# Patient Record
Sex: Female | Born: 1950 | Race: White | Hispanic: No | Marital: Married | Smoking: Never smoker
Health system: Southern US, Community
[De-identification: ages and names within clinical notes are randomized; demographics above are authoritative.]

## PROBLEM LIST (undated history)

## (undated) DIAGNOSIS — R42 Dizziness and giddiness: Secondary | ICD-10-CM

## (undated) DIAGNOSIS — G8929 Other chronic pain: Secondary | ICD-10-CM

## (undated) DIAGNOSIS — K219 Gastro-esophageal reflux disease without esophagitis: Secondary | ICD-10-CM

## (undated) DIAGNOSIS — R51 Headache: Secondary | ICD-10-CM

## (undated) DIAGNOSIS — R1310 Dysphagia, unspecified: Secondary | ICD-10-CM

## (undated) DIAGNOSIS — M25512 Pain in left shoulder: Secondary | ICD-10-CM

## (undated) DIAGNOSIS — I10 Essential (primary) hypertension: Secondary | ICD-10-CM

## (undated) DIAGNOSIS — F32A Depression, unspecified: Secondary | ICD-10-CM

## (undated) DIAGNOSIS — F329 Major depressive disorder, single episode, unspecified: Secondary | ICD-10-CM

## (undated) DIAGNOSIS — M549 Dorsalgia, unspecified: Secondary | ICD-10-CM

## (undated) HISTORY — PX: ESOPHAGOGASTRODUODENOSCOPY: SHX1529

## (undated) HISTORY — PX: TRACHEOSTOMY: SUR1362

## (undated) HISTORY — DX: Depression, unspecified: F32.A

## (undated) HISTORY — PX: COLONOSCOPY: SHX174

## (undated) HISTORY — PX: TUBAL LIGATION: SHX77

## (undated) HISTORY — DX: Major depressive disorder, single episode, unspecified: F32.9

---

## 2000-06-06 ENCOUNTER — Emergency Department (HOSPITAL_COMMUNITY): Admission: EM | Admit: 2000-06-06 | Discharge: 2000-06-06 | Payer: Self-pay | Admitting: *Deleted

## 2000-06-06 ENCOUNTER — Encounter: Payer: Self-pay | Admitting: *Deleted

## 2001-11-27 ENCOUNTER — Emergency Department (HOSPITAL_COMMUNITY): Admission: EM | Admit: 2001-11-27 | Discharge: 2001-11-27 | Payer: Self-pay | Admitting: Emergency Medicine

## 2004-10-08 ENCOUNTER — Emergency Department (HOSPITAL_COMMUNITY): Admission: EM | Admit: 2004-10-08 | Discharge: 2004-10-08 | Payer: Self-pay | Admitting: Emergency Medicine

## 2006-01-27 ENCOUNTER — Emergency Department (HOSPITAL_COMMUNITY): Admission: EM | Admit: 2006-01-27 | Discharge: 2006-01-27 | Payer: Self-pay | Admitting: Emergency Medicine

## 2006-12-12 ENCOUNTER — Emergency Department (HOSPITAL_COMMUNITY): Admission: EM | Admit: 2006-12-12 | Discharge: 2006-12-12 | Payer: Self-pay | Admitting: *Deleted

## 2007-10-24 ENCOUNTER — Encounter: Admission: RE | Admit: 2007-10-24 | Discharge: 2007-10-24 | Payer: Self-pay | Admitting: Family Medicine

## 2007-11-08 ENCOUNTER — Ambulatory Visit: Payer: Self-pay | Admitting: Gastroenterology

## 2007-11-14 ENCOUNTER — Ambulatory Visit (HOSPITAL_COMMUNITY): Admission: RE | Admit: 2007-11-14 | Discharge: 2007-11-14 | Payer: Self-pay | Admitting: Gastroenterology

## 2007-11-14 ENCOUNTER — Ambulatory Visit: Payer: Self-pay | Admitting: Gastroenterology

## 2008-04-15 ENCOUNTER — Observation Stay (HOSPITAL_COMMUNITY): Admission: EM | Admit: 2008-04-15 | Discharge: 2008-04-18 | Payer: Self-pay | Admitting: Emergency Medicine

## 2010-04-22 LAB — COMPREHENSIVE METABOLIC PANEL
Albumin: 3.9 g/dL (ref 3.5–5.2)
BUN: 6 mg/dL (ref 6–23)
Calcium: 9.1 mg/dL (ref 8.4–10.5)
Chloride: 103 mEq/L (ref 96–112)
Creatinine, Ser: 0.81 mg/dL (ref 0.4–1.2)
GFR calc Af Amer: >60 mL/min (ref >60)
Total Bilirubin: 0.4 mg/dL (ref 0.3–1.2)
Total Protein: 7.6 g/dL (ref 6.0–8.3)

## 2010-04-22 LAB — URINALYSIS, ROUTINE W REFLEX MICROSCOPIC
Hgb urine dipstick: NEGATIVE
Nitrite: NEGATIVE
Specific Gravity, Urine: 1.025 (ref 1.005–1.030)
Urobilinogen, UA: 0.2 mg/dL (ref 0.0–1.0)
pH: 6.5 (ref 5.0–8.0)

## 2010-04-22 LAB — CBC
HCT: 38.8 % (ref 36.0–46.0)
HCT: 41.5 % (ref 36.0–46.0)
Hemoglobin: 13.1 g/dL (ref 12.0–15.0)
MCHC: 33.5 g/dL (ref 30.0–36.0)
MCHC: 34 g/dL (ref 30.0–36.0)
MCHC: 34.3 g/dL (ref 30.0–36.0)
MCV: 90.1 fL (ref 78.0–100.0)
MCV: 90.4 fL (ref 78.0–100.0)
MCV: 91.1 fL (ref 78.0–100.0)
Platelets: 313 10*3/uL (ref 150–400)
RBC: 4.24 MIL/uL (ref 3.87–5.11)
RBC: 4.3 MIL/uL (ref 3.87–5.11)
RDW: 14.4 % (ref 11.5–15.5)
WBC: 11.4 10*3/uL — ABNORMAL HIGH (ref 4.0–10.5)
WBC: 8.2 10*3/uL (ref 4.0–10.5)

## 2010-04-22 LAB — LIPID PANEL
Cholesterol: 245 mg/dL — ABNORMAL HIGH (ref 0–200)
LDL Cholesterol: 189 mg/dL — ABNORMAL HIGH (ref 0–99)
VLDL: 11 mg/dL (ref 0–40)

## 2010-04-22 LAB — DIFFERENTIAL
Basophils Absolute: 0 10*3/uL (ref 0.0–0.1)
Basophils Relative: 1 % (ref 0–1)
Basophils Relative: 1 % (ref 0–1)
Eosinophils Absolute: 0 10*3/uL (ref 0.0–0.7)
Eosinophils Absolute: 0 10*3/uL (ref 0.0–0.7)
Eosinophils Relative: 0 % (ref 0–5)
Lymphocytes Relative: 9 % — ABNORMAL LOW (ref 12–46)
Lymphs Abs: 0.7 10*3/uL (ref 0.7–4.0)
Lymphs Abs: 1 10*3/uL (ref 0.7–4.0)
Lymphs Abs: 2.1 10*3/uL (ref 0.7–4.0)
Monocytes Absolute: 0.1 10*3/uL (ref 0.1–1.0)
Monocytes Absolute: 0.4 10*3/uL (ref 0.1–1.0)
Monocytes Absolute: 0.8 10*3/uL (ref 0.1–1.0)
Monocytes Relative: 10 % (ref 3–12)
Monocytes Relative: 3 % (ref 3–12)
Neutro Abs: 6.9 10*3/uL (ref 1.7–7.7)
Neutro Abs: 9.9 10*3/uL — ABNORMAL HIGH (ref 1.7–7.7)
Neutrophils Relative %: 87 % — ABNORMAL HIGH (ref 43–77)

## 2010-04-22 LAB — URINE CULTURE: Colony Count: NO GROWTH

## 2010-04-22 LAB — BASIC METABOLIC PANEL
CO2: 25 mEq/L (ref 19–32)
CO2: 26 mEq/L (ref 19–32)
Chloride: 106 mEq/L (ref 96–112)
Chloride: 107 mEq/L (ref 96–112)
Creatinine, Ser: 0.66 mg/dL (ref 0.4–1.2)
GFR calc Af Amer: >60 mL/min (ref >60)
GFR calc Af Amer: >60 mL/min (ref >60)
Potassium: 3.9 mEq/L (ref 3.5–5.1)
Potassium: 4 mEq/L (ref 3.5–5.1)
Sodium: 137 mEq/L (ref 135–145)

## 2010-04-22 LAB — PROTIME-INR
INR: 1 (ref 0.00–1.49)
Prothrombin Time: 13.1 seconds (ref 11.6–15.2)

## 2010-04-22 LAB — CULTURE, BLOOD (ROUTINE X 2)

## 2010-04-22 LAB — TRICYCLICS SCREEN, URINE: TCA Scrn: NOT DETECTED

## 2010-04-22 LAB — RAPID URINE DRUG SCREEN, HOSP PERFORMED
Amphetamines: NOT DETECTED
Benzodiazepines: NOT DETECTED

## 2010-04-22 LAB — POCT CARDIAC MARKERS

## 2010-04-22 LAB — HEMOGLOBIN A1C: Mean Plasma Glucose: 114 mg/dL
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
## 2010-05-26 NOTE — Group Therapy Note (Signed)
NAMEKATRINKA, [name] NO.:  [id number]   MEDICAL RECORD NO.:  [id number]          PATIENT TYPE:  OBV   LOCATION:  A319                          FACILITY:  [facility]   PHYSICIAN:  Osvaldo Shipper, MD     DATE OF BIRTH:  [birth year]   DATE OF PROCEDURE:  04/17/2008  DATE OF DISCHARGE:                                 PROGRESS NOTE   SUBJECTIVE:  The patient denies any nausea, vomiting or diarrhea;  however, she mentioned that her dizziness still persists.  She describes  it as a spinning of the room around her.  She mentioned an episode just  a few minutes ago, and she is very disconcerted by this symptomatology.  She admits to ringing in the ears.  She admits to having hearing loss in  the right ear many years ago but not currently.  She has not been  diagnosed with any ear problem.  She denies any focal deficits.   VITAL SIGNS:  Temperature:  Finally, she is afebrile, 98.2.  Heart rate  78, respiratory rate 20, blood pressure 158/79, saturation 98% on room  air.  GENERAL EXAM:  This is an overweight white female in no distress.  HEENT:  No pallor.  No icterus.  No nystagmus was noted.  LUNGS:  Clear to auscultation bilaterally.  No wheezing, rales or  rhonchi.  CARDIOVASCULAR:  S1, S2 is normal, regular.  No murmurs appreciated.  No  S3, S4.  No rubs.  No bruits.  ABDOMEN:  Soft, nontender, nondistended.  Bowel sounds are present.  No  masses or organomegaly are appreciated.  EXTREMITIES:  Show no peripheral edema.  MUSCULOSKELETAL:  Unremarkable.  NEUROLOGIC:  She is alert and oriented x3.  No focal neurologic deficits  are present.  Cerebellar signs were negative.   Labs:  CBC unremarkable.  BMET unremarkable.  Glucose actually was 105.  HgbA1c was 5.6.  Total cholesterol 245, LDL is 189.   ASSESSMENT AND PLAN:  1. Dizziness, most likely vertigo; however, considering her high blood      pressures we need to rule out intracranial process, so we will get      an  MRI of the brain.  We will in the meantime start her on      meclizine and Valium.  Antiemetics are being provided on an as-      needed basis.  PT evaluation will be done.  2. Hypertension.  Blood pressures are better controlled after she was      started on Lopressor, which will be continued.  3. Nausea, vomiting and diarrhea, likely secondary to gastroenteritis.      These have also improved.  IV will be saline-locked.  4. Dyslipidemia.  I will defer treatment for this to a primary medical      doctor.  The patient does not have one.  She said she has a lot of      financial difficulties with affording medications.  Without any      kind of followup, I will be reluctant to start the patient on a  statin agent.  These cholesterol levels will need to be discussed      with the patient at the time of discharge.  5. She is on DVT prophylaxis with sequential compressive devices.  6. Protonix will be changed over to p.o.   So we await MRI.  We await improvement in her symptomatology.  I am  hoping if everything comes back negative and if she is better, she  should be able to go home in the next 24-36 hours.      Osvaldo Shipper, MD  Electronically Signed     GK/MEDQ  D:  04/17/2008  T:  04/17/2008  Job:  [id number]

## 2010-05-26 NOTE — Discharge Summary (Signed)
[name], CARBARY NO.:  [id number]   MEDICAL RECORD NO.:  [id number]          PATIENT TYPE:  OBV   LOCATION:  A319                          FACILITY:  [facility]   PHYSICIAN:  Dorris Singh, DO    DATE OF BIRTH:  [birth year]   DATE OF ADMISSION:  04/15/2008  DATE OF DISCHARGE:  04/08/2010LH                               DISCHARGE SUMMARY   PRIMARY CARE PHYSICIAN:  A doctor in [place], his name is Dr.  Tanya Nones.   There were no consults made.   ADMISSION DIAGNOSES:  1. Acute viral illness.  2. Possible acute gastroenteritis.  3. Transient rectal bleeding.  4. High blood pressure.  5. History of high cholesterol.   DISCHARGE DIAGNOSES:  1. Vertigo.  2. Hypertension.  3. Nausea and vomiting, which has resolved.  4. High cholesterol.   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  A carotid Doppler as well on the 7th which  demonstrated no evidence of significant plaque formation or stenosis.   To summarize her hospital stay, the patient is a 57-year Caucasian  female with the medical history noted in her H and P, who had a 12-hour  duration of nausea and vomiting with a couple episodes of diarrhea and  dizziness.  She was admitted to the hospital with the above diagnoses.  There were concerns as to whether that could be food poisoning or  influenza.  The patient continued to progress and started to complain  more of dizziness spells, more along the lines of vertigo.  She was  started on meclizine and this seemed to improve.  Also, there were some  issues with some transient rectal bleeding, but this was secondary to  the gastroenteritis.  On the 7th the patient stated that she felt much  better.  However, she still was concerned about the dizziness, so  testing was done to ensure that this was not an intracranial process and  her tests were negative.  On the 8th the patient stated that she wanted  to go home.  She felt 100% better, was asked if she wanted home health  care.  She stated no, that her family and her husband can take care of  her.   DISCHARGE MEDICATIONS INCLUDE:  Tylenol as needed.  The patient did not  have her blood pressure or cholesterol pills because she cannot afford  to take them, this was in her H and P.  She will be sent home on  meclizine 12.5 mg 1 p.o. t.i.d.   It is recommended also for her to follow up with Dr. Tanya Nones, we will  set up an appointment for her prior to discharge.  Also, if symptoms  worsen, she is to return.   CONDITION ON DISCHARGE:  Stable.   Her vitals were reviewed as well as her lab work.  Dorris Singh, DO  Electronically Signed     CB/MEDQ  D:  04/18/2008  T:  04/18/2008  Job:  [id number]

## 2010-05-26 NOTE — Op Note (Signed)
[name], [name]              ACCOUNT NO.:  [id number]   MEDICAL RECORD NO.:  [id number]          PATIENT TYPE:  AMB   LOCATION:  DAY                           FACILITY:  [facility]   PHYSICIAN:  Kassie Mends, M.D.      DATE OF BIRTH:  [birth year]   DATE OF PROCEDURE:  11/14/2007  DATE OF DISCHARGE:                               OPERATIVE REPORT   REFERRING PHYSICIAN:  Broadus John T. Pamalee Leyden, MD, of [facility].   PROCEDURE:  Colonoscopy.   INDICATIONS FOR EXAM:  [name] is a 60 year old female who presents  with rectal bleeding.   FINDINGS:  1. Pancolonic diverticulosis most pronounced in the sigmoid colon.  2. Slightly tortuous colon.  Otherwise, no polyps, masses,      inflammatory changes, or AVMs seen.  3. Small internal hemorrhoids.  Otherwise, normal retroflexed view of      the rectum.   RECOMMENDATIONS:  1. Anusol-HC per rectum every 12 hours for 10 days.  2. She should follow a high-fiber diet.  She is given a handout on      high-fiber diet, constipation, and hemorrhoids.  3. No aspirin, NSAIDs, or anticoagulation for 7 days.  4. She should drink 6-8 cups of water daily and add Benefiber twice a      day.  5. Screening colonoscopy in 10 years.  6. Follow up with Lorenza Burton in 2 months regarding her      constipation.   MEDICATIONS:  1. Demerol 100 mg IV.  2. Versed 5 mg IV.   PROCEDURE TECHNIQUE:  Physical exam was performed.  Informed consent was  obtained from the patient after explaining the benefits, risks, and  alternatives to the procedure.  The patient was connected to the monitor  and placed in left lateral position.  Continuous oxygen was provided by  nasal cannula and IV medicine administered through an indwelling  cannula.  After administration of sedation and rectal exam, the  patient's rectum was intubated.  Scope was advanced under direct visualization to the cecum.  The scope  was removed slowly by carefully  examining the color, texture, anatomy,  and integrity of the mucosa on the way out.  The patient was recovered  in endoscopy and discharged home in satisfactory condition.      Kassie Mends, M.D.  Electronically Signed     SM/MEDQ  D:  11/14/2007  T:  11/14/2007  Job:  [id number]   cc:   Ernestina Penna, M.D.  Fax: [phone]

## 2010-05-26 NOTE — Group Therapy Note (Signed)
[name], MILHAM NO.:  [id number]   MEDICAL RECORD NO.:  [id number]          PATIENT TYPE:  OBV   LOCATION:  A319                          FACILITY:  [facility]   PHYSICIAN:  Margaretmary Dys, M.D.DATE OF BIRTH:  07/02/1950   DATE OF PROCEDURE:  04/16/2008  DATE OF DISCHARGE:                                 PROGRESS NOTE   SUBJECTIVE:  Patient feels a little bit better.  She has had no more  vomiting but still feels nauseous.  Abdominal pain is improved.  She has  had no rectal bleeding since arriving here or diarrhea.   OBJECTIVE:  Conscious, alert, comfortable, not in distress.  VITAL SIGNS:  Blood pressure is 131/53.  Pulse of 96.  Respirations 20.  Temperature is 100 degrees Fahrenheit.  Oxygen saturation is 96% on room  air.  HEENT EXAM:  Normocephalic, atraumatic.  Oral mucosa was dry.  No  exudates were noted.  NECK:  Supple.  No JVD or lymphadenopathy.  LUNGS:  Reduced air entry bilaterally.  HEART:  S1 and S2, regular.  No S3, S4, gallops, or rubs.  ABDOMEN:  Soft, also had mild tenderness.  Bowel sounds were positive.  There were no masses palpable.  EXTREMITIES:  No edema.  No calf induration or tenderness was noted.  CNS EXAM:  Grossly intact.   LABORATORY DATA:  White blood count was 4.4, hemoglobin of 13,  hematocrit 38.6, platelet count was 250 with 87% neutrophils.  PT was  36.1, INR was 1.0.  Sodium is 137, potassium is 4.0, chloride of 106,  CO2 is 25, glucose 131, BUN of 5, creatinine was 0.66, calcium is 9.  Blood cultures are negative thus far.  Influenza antigen was negative.   ASSESSMENT AND PLAN:  1. Acute viral illness with gastroenteritis.  2. Transient rectal bleeding likely secondary from gastroenteritis.  3. Hypertension.   PLAN:  1. Continue IV fluid therapy and pain control as needed.  2. We will also continue antiemetics as needed.   Patient's blood pressure is slightly improved.  Patient was started on  Lopressor  yesterday.   Patient has a history of hypertension but never treated it.   Her blood sugar on admission was noted to be elevated.  She probably has  some glucose intolerance.  Today, her blood sugar is down to 131 but  hemoglobin A1c testing is pending.   DISPOSITION:  Patient likely home tomorrow once her symptoms have been  significantly resolved.  She will need close followup with her primary  care physician for other medical issues such as hypertension and  previously diagnosed dyslipidemia, both of which patient was not  receiving treatment chronically.      Margaretmary Dys, M.D.  Electronically Signed     AM/MEDQ  D:  04/16/2008  T:  04/16/2008  Job:  [id number]
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
## 2011-04-29 ENCOUNTER — Ambulatory Visit: Payer: Self-pay | Admitting: Family Medicine

## 2011-04-29 ENCOUNTER — Encounter: Payer: Self-pay | Admitting: Family Medicine

## 2011-09-13 ENCOUNTER — Encounter (HOSPITAL_COMMUNITY): Payer: Self-pay | Admitting: *Deleted

## 2011-09-13 ENCOUNTER — Emergency Department (HOSPITAL_COMMUNITY)
Admission: EM | Admit: 2011-09-13 | Discharge: 2011-09-13 | Disposition: A | Discharge status: Home / Self Care | Payer: Medicaid Other | Attending: Emergency Medicine | Admitting: Emergency Medicine

## 2011-09-13 DIAGNOSIS — S335XXA Sprain of ligaments of lumbar spine, initial encounter: Secondary | ICD-10-CM | POA: Insufficient documentation

## 2011-09-13 DIAGNOSIS — X58XXXA Exposure to other specified factors, initial encounter: Secondary | ICD-10-CM | POA: Insufficient documentation

## 2011-09-13 DIAGNOSIS — S39012A Strain of muscle, fascia and tendon of lower back, initial encounter: Secondary | ICD-10-CM

## 2011-09-13 MED ORDER — CYCLOBENZAPRINE HCL 10 MG PO TABS: ORAL_TABLET | ORAL | Status: DC

## 2011-09-13 MED ORDER — HYDROCODONE-ACETAMINOPHEN 5-325 MG PO TABS: 1.0000 | ORAL_TABLET | Freq: Four times a day (QID) | ORAL | Status: AC | PRN

## 2011-09-13 NOTE — ED Provider Notes (Signed)
History     CSN: [id number]  Arrival date & time 09/13/11  3244   First MD Initiated Contact with Patient 09/13/11 2004      Chief Complaint  Patient presents with  . Back Pain    (Consider location/radiation/quality/duration/timing/severity/associated sxs/prior treatment) HPI Comments: States her dog ran into and she nearly fell but grabbed a railing.  Feels like she "twisted something".  H/o chronic back pain.  Took one dose of bayer aspirin with no relief.  No PCP   Patient is a 61 y.o. female presenting with back pain. The history is provided by the patient and the spouse. No language interpreter was used.  Back Pain  This is a recurrent problem. The problem occurs constantly. The problem has not changed since onset.The pain is associated with no known injury. The pain is present in the lumbar spine. The quality of the pain is described as aching. The pain does not radiate. The pain is severe. The symptoms are aggravated by bending and twisting. The pain is the same all the time. Pertinent negatives include no numbness, no bowel incontinence, no bladder incontinence, no leg pain, no paresthesias, no paresis, no tingling and no weakness. Treatments tried: aspirin. The treatment provided no relief.    History reviewed. No pertinent past medical history.  Past Surgical History  Procedure Date  . Tracheostomy     History reviewed. No pertinent family history.  History  Substance Use Topics  . Smoking status: Never Smoker   . Smokeless tobacco: Not on file  . Alcohol Use: No    OB History    Grav Para Term Preterm Abortions TAB SAB Ect Mult Living                  Review of Systems  Gastrointestinal: Negative for bowel incontinence.  Genitourinary: Negative for bladder incontinence.  Musculoskeletal: Positive for back pain.  Neurological: Negative for tingling, weakness, numbness and paresthesias.    Allergies  Review of patient's allergies indicates no known  allergies.  Home Medications   Current Outpatient Rx  Name Route Sig Dispense Refill  . CYCLOBENZAPRINE HCL 10 MG PO TABS  1/2 to one tab po TID 20 tablet 0  . HYDROCODONE-ACETAMINOPHEN 5-325 MG PO TABS Oral Take 1 tablet by mouth every 6 (six) hours as needed for pain. 12 tablet 0    BP 162/65  Pulse 73  Temp 98.1 F (36.7 C) (Oral)  Resp 18  Ht 5' 1.5" (1.562 m)  Wt 160 lb (72.576 kg)  BMI 29.74 kg/m2  SpO2 100%  Physical Exam  Nursing note and vitals reviewed. Constitutional: She is oriented to person, place, and time. She appears well-developed and well-nourished. No distress.  HENT:  Head: Normocephalic and atraumatic.  Eyes: EOM are normal.  Neck: Normal range of motion.  Cardiovascular: Normal rate, regular rhythm and normal heart sounds.   Pulmonary/Chest: Effort normal and breath sounds normal.  Abdominal: Soft. She exhibits no distension. There is no tenderness.  Musculoskeletal:       Lumbar back: She exhibits decreased range of motion, tenderness and pain. She exhibits no bony tenderness, no swelling, no edema, no deformity, no laceration, no spasm and normal pulse.       Back:  Neurological: She is alert and oriented to person, place, and time.  Skin: Skin is warm and dry.  Psychiatric: She has a normal mood and affect. Judgment normal.    ED Course  Procedures (including critical care time)  Labs  Reviewed - No data to display No results found.   1. Lumbar strain       MDM  rx-hydrocodone, 12 rx-flexeril 10 mg, 20 Ibuprofen Ice F/u with dr. Hilda Lias prn        Evalina Field, PA 09/13/11 2105

## 2011-09-13 NOTE — ED Notes (Addendum)
Tripped by dog 2 weeks ago , pain rt post lower thorax , Tripped again to day and had increasing pain.  Alert,   Caught onto the door knob and did not fall down.

## 2011-09-13 NOTE — ED Notes (Signed)
Pt discharged. Pt stable at time of discharge. Medications reviewed pt has no questions regarding discharge at this time. Pt voiced understanding of discharge instructions.  

## 2011-09-14 NOTE — ED Provider Notes (Signed)
Medical screening examination/treatment/procedure(s) were performed by non-physician practitioner and as supervising physician I was immediately available for consultation/collaboration.   Joya Gaskins, MD 09/14/11 [phone]

## 2011-11-29 ENCOUNTER — Encounter (HOSPITAL_COMMUNITY): Payer: Self-pay

## 2011-11-29 ENCOUNTER — Emergency Department (HOSPITAL_COMMUNITY): Payer: Medicaid Other

## 2011-11-29 ENCOUNTER — Emergency Department (HOSPITAL_COMMUNITY)
Admission: EM | Admit: 2011-11-29 | Discharge: 2011-11-29 | Disposition: A | Discharge status: Home / Self Care | Payer: Medicaid Other | Attending: Emergency Medicine | Admitting: Emergency Medicine

## 2011-11-29 DIAGNOSIS — IMO0002 Reserved for concepts with insufficient information to code with codable children: Secondary | ICD-10-CM | POA: Insufficient documentation

## 2011-11-29 DIAGNOSIS — Y9389 Activity, other specified: Secondary | ICD-10-CM | POA: Insufficient documentation

## 2011-11-29 DIAGNOSIS — Y929 Unspecified place or not applicable: Secondary | ICD-10-CM | POA: Insufficient documentation

## 2011-11-29 DIAGNOSIS — T17320A Food in larynx causing asphyxiation, initial encounter: Secondary | ICD-10-CM

## 2011-11-29 DIAGNOSIS — T17308A Unspecified foreign body in larynx causing other injury, initial encounter: Secondary | ICD-10-CM | POA: Insufficient documentation

## 2011-11-29 HISTORY — DX: Headache: R51

## 2011-11-29 NOTE — ED Provider Notes (Signed)
History   This chart was scribed for Geoffery Lyons, MD by Charolett Bumpers, ER Scribe. The patient was seen in room APA08/APA08. Patient's care was started at 3:06PM.    CSN: [id number]  Arrival date & time 11/29/11  1346   First MD Initiated Contact with Patient 11/29/11 1506      Chief Complaint  Patient presents with  . Aspiration    The history is provided by the patient. No language interpreter was used.    [name] is a 61 y.o. female who presents to the Emergency Department complaining of choking on a piece hamberger steak needing the Heimlich maneuver. Pt reports vomiting 3 times before the lodged food piece was expelled. Pt denies having trouble breathing or swallowing since the incident and denies eating or drinking anything since then. Pt denies headaches, abdominal pain. Pt has a h/o tracheostomy years ago. Pt is not on any regular medications.    Past Medical History  Diagnosis Date  . Headache     Past Surgical History  Procedure Date  . Tracheostomy     No family history on file.  History  Substance Use Topics  . Smoking status: Never Smoker   . Smokeless tobacco: Not on file  . Alcohol Use: No    OB History    Grav Para Term Preterm Abortions TAB SAB Ect Mult Living                  Review of Systems  Constitutional: Negative for fever.  HENT: Negative for trouble swallowing.   Respiratory: Positive for choking (2 hours ago). Negative for shortness of breath.   Gastrointestinal: Negative for nausea, vomiting and abdominal pain.  Neurological: Negative for headaches.  All other systems reviewed and are negative.    Allergies  Review of patient's allergies indicates no known allergies.  Home Medications   Current Outpatient Rx  Name  Route  Sig  Dispense  Refill  . BC HEADACHE POWDER PO   Oral   Take 1 packet by mouth every 4 (four) hours as needed. Pain         . IBUPROFEN 200 MG PO TABS   Oral   Take 400 mg by mouth  every 6 (six) hours as needed. Pain           BP 169/87  Pulse 85  Temp 98.6 F (37 C) (Oral)  Resp 20  Ht 5' 1.5" (1.562 m)  Wt 160 lb (72.576 kg)  BMI 29.74 kg/m2  SpO2 99%  Physical Exam  Nursing note and vitals reviewed. Constitutional: She is oriented to person, place, and time. She appears well-developed and well-nourished. No distress.  HENT:  Head: Normocephalic and atraumatic.  Right Ear: External ear normal.  Left Ear: External ear normal.  Nose: Nose normal.  Mouth/Throat: Oropharynx is clear and moist.  Eyes: Conjunctivae normal and EOM are normal. Pupils are equal, round, and reactive to light.  Neck: Normal range of motion. Neck supple. No tracheal deviation present.  Cardiovascular: Normal rate, regular rhythm and normal heart sounds.   Pulmonary/Chest: Effort normal and breath sounds normal. No respiratory distress. She has no wheezes.  Abdominal: Soft. Bowel sounds are normal. She exhibits no distension.  Musculoskeletal: Normal range of motion. She exhibits no edema.  Neurological: She is alert and oriented to person, place, and time.  Skin: Skin is warm and dry.  Psychiatric: She has a normal mood and affect. Her behavior is normal.  ED Course  Procedures (including critical care time)   DIAGNOSTIC STUDIES: Oxygen Saturation is 99% on room air, normal by my interpretation.    COORDINATION OF CARE:  3:14PM Discussed planned course of treatment with the patient, who is agreeable at this time.     Labs Reviewed - No data to display  Dg Chest 2 View  11/29/2011  *RADIOLOGY REPORT*  Clinical Data: Aspiration.  The patient was choking on a piece of meat which required a Heimlich maneuver.  She denies any ongoing complaints.  CHEST - 2 VIEW  Comparison: One-view chest 04/15/1008.  Findings: The heart size is normal.  The lungs are clear.  The visualized soft tissues and bony thorax are unremarkable.  IMPRESSION: Negative chest.   Original Report  Authenticated By: Marin Roberts, M.D.      No diagnosis found.    MDM  The patient presents here after aspirating (or an esophageal impaction) of steak while eating at a restaurant.  She was given the heimlich maneuver and is now breathing and swallowing fine.  The xrays and physical exam are unremarkable.  Will discharge to home.  To return prn for shortness of breath or fever.       I personally performed the services described in this documentation, which was scribed in my presence. The recorded information has been reviewed and is accurate.         Geoffery Lyons, MD 11/29/11 [phone]

## 2011-11-29 NOTE — ED Notes (Signed)
Pt states she was eating hamberger steak and got choked. Per EMS, fire dept was doing abd thrust to remove meat. Pt has no resp distress at present but states her throat is a little sore.

## 2013-11-20 ENCOUNTER — Ambulatory Visit (INDEPENDENT_AMBULATORY_CARE_PROVIDER_SITE_OTHER): Payer: BC Managed Care – PPO | Admitting: Family Medicine

## 2013-11-20 ENCOUNTER — Encounter: Payer: Self-pay | Admitting: Family Medicine

## 2013-11-20 VITALS — BP 140/80 | HR 80 | Temp 98.0°F | Resp 18 | Ht 61.5 in | Wt 180.0 lb

## 2013-11-20 DIAGNOSIS — M25512 Pain in left shoulder: Secondary | ICD-10-CM

## 2013-11-20 DIAGNOSIS — F329 Major depressive disorder, single episode, unspecified: Secondary | ICD-10-CM

## 2013-11-20 DIAGNOSIS — F32A Depression, unspecified: Secondary | ICD-10-CM

## 2013-11-20 MED ORDER — FLUOXETINE HCL 20 MG PO TABS: 20.0000 mg | ORAL_TABLET | Freq: Every day | ORAL | Status: DC

## 2013-11-20 MED ORDER — DICLOFENAC SODIUM 75 MG PO TBEC: 75.0000 mg | DELAYED_RELEASE_TABLET | Freq: Two times a day (BID) | ORAL | Status: DC

## 2013-11-20 NOTE — Progress Notes (Signed)
Subjective:    Patient ID: [name], female    DOB: Oct 18, 1950, 63 y.o.   MRN: [id number]  HPI Patient states that she has dealt with depression her entire life. Recently her brother passed away and this is only exacerbated her depression.  She reports anhedonia, poor concentration, poor sleep, low energy, and daily crying spells. She denies any suicidal ideation. She also complains of pain in her left shoulder. The pain is worse with abduction greater than 90. Past Medical History  Diagnosis Date  . ZDGLOVFI(433.2)    Past Surgical History  Procedure Laterality Date  . Tracheostomy     Current Outpatient Prescriptions on File Prior to Visit  Medication Sig Dispense Refill  . Aspirin-Salicylamide-Caffeine (BC HEADACHE POWDER PO) Take 1 packet by mouth every 4 (four) hours as needed. Pain     No current facility-administered medications on file prior to visit.   No Known Allergies History   Social History  . Marital Status: Married    Spouse Name: N/A    Number of Children: N/A  . Years of Education: N/A   Occupational History  . Not on file.   Social History Main Topics  . Smoking status: Never Smoker   . Smokeless tobacco: Not on file  . Alcohol Use: No  . Drug Use: No  . Sexual Activity: Not on file   Other Topics Concern  . Not on file   Social History Narrative      Review of Systems  All other systems reviewed and are negative.      Objective:   Physical Exam  Cardiovascular: Normal rate and regular rhythm.   Pulmonary/Chest: Effort normal and breath sounds normal.  Musculoskeletal:       Left shoulder: She exhibits decreased range of motion, tenderness, pain and decreased strength.  Vitals reviewed.         Assessment & Plan:  Depression - Plan: FLUoxetine (PROZAC) 20 MG tablet  Left shoulder pain - Plan: diclofenac (VOLTAREN) 75 MG EC tablet  Begin the patient on Prozac 20 mg by mouth daily and recheck in 1 month. She can take  diclofenac 75 mg by mouth twice a day for I believe is bursitis in her left shoulder. Return immediately if symptoms worsen.

## 2013-11-27 ENCOUNTER — Telehealth: Payer: Self-pay | Admitting: Family Medicine

## 2013-11-27 NOTE — Telephone Encounter (Signed)
Patient is having some reactions to the prozac she was prescribed, would like a call back at [phone]

## 2013-11-28 ENCOUNTER — Other Ambulatory Visit: Payer: Self-pay | Admitting: Family Medicine

## 2013-11-28 ENCOUNTER — Telehealth: Payer: Self-pay | Admitting: Family Medicine

## 2013-11-28 MED ORDER — FLUOXETINE HCL 10 MG PO CAPS: ORAL_CAPSULE | ORAL | Status: DC

## 2013-11-28 NOTE — Telephone Encounter (Signed)
Called pt and she states that since starting the prozac she has experienced some side effects - dry mouth, dizziness, fatigue and HA's (which she states she has anyway and has not noticed and increase in them since starting the med). Wanting to know what to do? She did stop the mediation yesterday. I did inform pt Dr. Tanya NonesPickard was out of town that this may get deferred to him when he gets back and she was ok with that if you prefer to wait for him to change her medication.

## 2013-11-28 NOTE — Telephone Encounter (Signed)
Pt made aware and states that it is a capsule she can not cut them in half. Offered to call in a new rx for 10mg  and she states that she does not have a way nor the money to pick them up. She wanted to know what she could do and i informed her that if she can not go get another rx or afford one that she will have to wait until she can find transportation and money before we could change her medicines. At that time she will call us back and we can call in 10mg  capsules for her to take and increase dosage as necessary. Pt verbalizes understanding.

## 2013-11-28 NOTE — Telephone Encounter (Signed)
Tell pt to try to take 1/2 tablet, these side effects are very common when first starting the medication, try the lower dose for a week and see how she does, she can try try to bump up to 20mg 

## 2013-11-28 NOTE — Telephone Encounter (Signed)
Pt called back and stated that she would like the medication called into the pharmacy and she could go pick it up this weekend. I explained to her how to take med and that it was normal for her to expereince those symptoms so we will start at a lower dose and then increase to 20mg  after a week and if symptoms do not resolve she is to call us back. She verbalizes understanding and med sent to pharm.

## 2013-12-11 ENCOUNTER — Telehealth: Payer: Self-pay | Admitting: Family Medicine

## 2013-12-11 NOTE — Telephone Encounter (Signed)
Patient needs a letter for social services stating that she has bursitis if possible, she would like to pick this up when ready  [phone]

## 2013-12-12 NOTE — Telephone Encounter (Signed)
Called and spoke to pt and she is trying to get help with her power bill through social services and she need a letter stating that she has bursitis and depression however there is not a definite dx about her having bursitis.

## 2013-12-13 ENCOUNTER — Encounter: Payer: Self-pay | Admitting: Family Medicine

## 2013-12-13 NOTE — Telephone Encounter (Signed)
Her last note discusses her depression and bursitis but I do not see how medically this can justify assistance with her power bill.

## 2013-12-13 NOTE — Telephone Encounter (Signed)
Per pt she just needs a note stating that she does have depression and bursitis

## 2013-12-20 ENCOUNTER — Encounter: Payer: Self-pay | Admitting: Family Medicine

## 2013-12-20 ENCOUNTER — Ambulatory Visit (INDEPENDENT_AMBULATORY_CARE_PROVIDER_SITE_OTHER): Payer: Medicare Other | Admitting: Family Medicine

## 2013-12-20 VITALS — BP 130/82 | HR 68 | Temp 98.4°F | Resp 16 | Ht 62.0 in | Wt 182.0 lb

## 2013-12-20 DIAGNOSIS — M25512 Pain in left shoulder: Secondary | ICD-10-CM

## 2013-12-20 DIAGNOSIS — F32A Depression, unspecified: Secondary | ICD-10-CM

## 2013-12-20 DIAGNOSIS — F329 Major depressive disorder, single episode, unspecified: Secondary | ICD-10-CM

## 2013-12-20 NOTE — Progress Notes (Signed)
Subjective:    Patient ID: [name], female    DOB: 1950/12/11, 63 y.o.   MRN: [id number]  HPI 11/20/13 Patient states that she has dealt with depression her entire life. Recently her brother passed away and this is only exacerbated her depression.  She reports anhedonia, poor concentration, poor sleep, low energy, and daily crying spells. She denies any suicidal ideation. She also complains of pain in her left shoulder. The pain is worse with abduction greater than 90.  At that time, my plan was: Begin the patient on Prozac 20 mg by mouth daily and recheck in 1 month. She can take diclofenac 75 mg by mouth twice a day for I believe is bursitis in her left shoulder. Return immediately if symptoms worsen.  12/20/13  Patient is here today for follow up.  Patient is to the shin is doing much better. She reports less anhedonia. She reports less fatigue. She reports less sleepiness. She denies any anxiety. Her concentration is improving. Her crying spells are improving. Unfortunate she continues to have pain in her left shoulder. She continues to have severe pain with abduction greater than 90. The diclofenac help but the pain persisted even on the diclofenac. She denies any falls or injuries. The pain has been there now total of 5-6 weeks. Past Medical History  Diagnosis Date  . UUVOZDGU(440.3)    Past Surgical History  Procedure Laterality Date  . Tracheostomy     Current Outpatient Prescriptions on File Prior to Visit  Medication Sig Dispense Refill  . Aspirin-Salicylamide-Caffeine (BC HEADACHE POWDER PO) Take 1 packet by mouth every 4 (four) hours as needed. Pain    . diclofenac (VOLTAREN) 75 MG EC tablet Take 1 tablet (75 mg total) by mouth 2 (two) times daily. 30 tablet 0  . FLUoxetine (PROZAC) 20 MG tablet Take 1 tablet (20 mg total) by mouth daily. 30 tablet 3   No current facility-administered medications on file prior to visit.   No Known Allergies History   Social  History  . Marital Status: Married    Spouse Name: N/A    Number of Children: N/A  . Years of Education: N/A   Occupational History  . Not on file.   Social History Main Topics  . Smoking status: Never Smoker   . Smokeless tobacco: Not on file  . Alcohol Use: No  . Drug Use: No  . Sexual Activity: Not on file   Other Topics Concern  . Not on file   Social History Narrative      Review of Systems  All other systems reviewed and are negative.      Objective:   Physical Exam  Cardiovascular: Normal rate and regular rhythm.   Pulmonary/Chest: Effort normal and breath sounds normal.  Musculoskeletal:       Left shoulder: She exhibits decreased range of motion, tenderness, pain and decreased strength.  Vitals reviewed.         Assessment & Plan:  Depression  Left shoulder pain  Continue Prozac. Patient is currently on 40 mg a day of Prozac. Seems to be doing better. I would like to give the medication more time. Recheck in 6 months. Patient has subacromial bursitis and possibly tendinitis in her left rotator cuff. After obtaining informed consent, using sterile technique, I injected the left shoulder with a mixture of 2 mL of 0.1% lidocaine, 2 mL of Marcaine, and 2 mL of 40 mg per mL Kenalog. Patient tolerated the procedure well without  any complications.

## 2014-05-07 ENCOUNTER — Encounter: Payer: Self-pay | Admitting: Family Medicine

## 2014-05-07 ENCOUNTER — Ambulatory Visit (INDEPENDENT_AMBULATORY_CARE_PROVIDER_SITE_OTHER): Payer: BLUE CROSS/BLUE SHIELD | Admitting: Family Medicine

## 2014-05-07 VITALS — BP 132/80 | HR 82 | Temp 98.1°F | Resp 18 | Wt 182.0 lb

## 2014-05-07 DIAGNOSIS — S93401A Sprain of unspecified ligament of right ankle, initial encounter: Secondary | ICD-10-CM | POA: Diagnosis not present

## 2014-05-07 MED ORDER — DICLOFENAC SODIUM 75 MG PO TBEC: 75.0000 mg | DELAYED_RELEASE_TABLET | Freq: Two times a day (BID) | ORAL | Status: DC

## 2014-05-07 NOTE — Progress Notes (Signed)
Subjective:    Patient ID: [name], female    DOB: Jun 17, 1950, 64 y.o.   MRN: [id number]  HPI This weekend, the patient tripped walking out of her home. She hyperflexed her right ankle. She also suffered an inversion injury to the right ankle. She now has pain and swelling over the right lateral malleolus. She is exquisitely tender to palpation over the right lateral malleolus. She has pain with flexion and extension of the ankle. It hurts to bear weight. There is some mild swelling and ankle. There is no bruising. There is no visible deformity to the ankle. She has no pain in the forefoot. There is no pain or swelling in the toes. Past Medical History  Diagnosis Date  . Headache(784.0)   . Depression    Past Surgical History  Procedure Laterality Date  . Tracheostomy     Current Outpatient Prescriptions on File Prior to Visit  Medication Sig Dispense Refill  . Aspirin-Salicylamide-Caffeine (BC HEADACHE POWDER PO) Take 1 packet by mouth every 4 (four) hours as needed. Pain    . FLUoxetine (PROZAC) 20 MG tablet Take 1 tablet (20 mg total) by mouth daily. 30 tablet 3   No current facility-administered medications on file prior to visit.   No Known Allergies History   Social History  . Marital Status: Married    Spouse Name: N/A  . Number of Children: N/A  . Years of Education: N/A   Occupational History  . Not on file.   Social History Main Topics  . Smoking status: Never Smoker   . Smokeless tobacco: Not on file  . Alcohol Use: No  . Drug Use: No  . Sexual Activity: Not on file   Other Topics Concern  . Not on file   Social History Narrative      Review of Systems  All other systems reviewed and are negative.      Objective:   Physical Exam  Constitutional: She appears well-developed and well-nourished.  Cardiovascular: Normal rate and regular rhythm.   Pulmonary/Chest: Effort normal and breath sounds normal.  Musculoskeletal:       Right ankle:  She exhibits decreased range of motion and swelling. She exhibits no ecchymosis, no deformity, no laceration and normal pulse. Tenderness. Lateral malleolus and AITFL tenderness found. No medial malleolus, no head of 5th metatarsal and no proximal fibula tenderness found. Achilles tendon exhibits no pain, no defect and normal Thompson's test results.  Vitals reviewed.         Assessment & Plan:  Sprain of ankle, right, initial encounter - Plan: DG Ankle Complete Right, diclofenac (VOLTAREN) 75 MG EC tablet  Patient sprained her ankle and I will obtain an x-ray to rule out an avulsion fracture of the distal fibula. I recommended the patient wear a lace up ankle brace for support. I recommended she rest ice and elevate her leg is much as possible. I recommended weightbearing as tolerated. I recommended diclofenac 75 mg by mouth twice a day. Certainly if there is a fracture seen on x-ray this plan may change. If this is a sprain, I anticipate gradual improvement over the next 2-4 weeks.

## 2014-05-08 ENCOUNTER — Ambulatory Visit (HOSPITAL_COMMUNITY)
Admission: RE | Admit: 2014-05-08 | Discharge: 2014-05-08 | Disposition: A | Discharge status: Home / Self Care | Payer: BLUE CROSS/BLUE SHIELD | Source: Ambulatory Visit | Attending: Family Medicine | Admitting: Family Medicine

## 2014-05-08 DIAGNOSIS — W19XXXA Unspecified fall, initial encounter: Secondary | ICD-10-CM | POA: Insufficient documentation

## 2014-05-08 DIAGNOSIS — S93401A Sprain of unspecified ligament of right ankle, initial encounter: Secondary | ICD-10-CM | POA: Diagnosis present

## 2014-05-27 ENCOUNTER — Other Ambulatory Visit: Payer: Self-pay | Admitting: Family Medicine

## 2014-06-14 ENCOUNTER — Telehealth: Payer: Self-pay | Admitting: Family Medicine

## 2014-06-14 NOTE — Telephone Encounter (Signed)
[phone] PT has called wanting to know if we can up her dosage on the diclofenac (VOLTAREN) 75 MG EC tablet because it is not helping her.

## 2014-06-17 NOTE — Telephone Encounter (Signed)
Tried calling pt number has a busy tone will try again later.[name].06/17/14

## 2014-06-17 NOTE — Telephone Encounter (Signed)
She is on the highest dose.  NTBS if pain is worse.

## 2014-06-18 ENCOUNTER — Encounter: Payer: Self-pay | Admitting: Family Medicine

## 2014-06-18 ENCOUNTER — Ambulatory Visit (INDEPENDENT_AMBULATORY_CARE_PROVIDER_SITE_OTHER): Payer: BLUE CROSS/BLUE SHIELD | Admitting: Family Medicine

## 2014-06-18 VITALS — BP 146/96 | HR 84 | Temp 98.1°F | Resp 18 | Ht 61.5 in | Wt 182.0 lb

## 2014-06-18 DIAGNOSIS — M25512 Pain in left shoulder: Secondary | ICD-10-CM | POA: Diagnosis not present

## 2014-06-18 DIAGNOSIS — S93401A Sprain of unspecified ligament of right ankle, initial encounter: Secondary | ICD-10-CM | POA: Diagnosis not present

## 2014-06-18 NOTE — Telephone Encounter (Signed)
Pt has appt today with Dr.Pickard and will discuss then

## 2014-06-18 NOTE — Progress Notes (Signed)
Subjective:    Patient ID: [name], female    DOB: 1950-06-11, 64 y.o.   MRN: [id number]  HPI 05/07/14 This weekend, the patient tripped walking out of her home. She hyperflexed her right ankle. She also suffered an inversion injury to the right ankle. She now has pain and swelling over the right lateral malleolus. She is exquisitely tender to palpation over the right lateral malleolus. She has pain with flexion and extension of the ankle. It hurts to bear weight. There is some mild swelling and ankle. There is no bruising. There is no visible deformity to the ankle. She has no pain in the forefoot. There is no pain or swelling in the toes.  At that time, my plan was: Patient sprained her ankle and I will obtain an x-ray to rule out an avulsion fracture of the distal fibula. I recommended the patient wear a lace up ankle brace for support. I recommended she rest ice and elevate her leg is much as possible. I recommended weightbearing as tolerated. I recommended diclofenac 75 mg by mouth twice a day. Certainly if there is a fracture seen on x-ray this plan may change. If this is a sprain, I anticipate gradual improvement over the next 2-4 weeks.  06/18/14  patient is here today for recheck. She continues to have some mild pain in her right ankle. The pain is usually over the lateral malleolus or the Achilles tendon. The pain is mild. Is not present on a daily basis. The diclofenac helps the pain. X-ray was negative for any fracture. She is walking without difficulty. She again is having pain in her left shoulder. It hurts with abduction greater than 90. It hurts wth internal and nomal rotation. She last received cortisone injectin in December. This heped her substantially. She would like to proceed with another cortisone injection today. Past Medical History  Diagnosis Date  . Headache(784.0)   . Depression    Past Surgical History  Procedure Laterality Date  . Tracheostomy     Current  Outpatient Prescriptions on File Prior to Visit  Medication Sig Dispense Refill  . Aspirin-Salicylamide-Caffeine (BC HEADACHE POWDER PO) Take 1 packet by mouth every 4 (four) hours as needed. Pain    . diclofenac (VOLTAREN) 75 MG EC tablet TAKE ONE TABLET BY MOUTH TWICE DAILY 30 tablet 5  . FLUoxetine (PROZAC) 20 MG tablet Take 1 tablet (20 mg total) by mouth daily. 30 tablet 3   No current facility-administered medications on file prior to visit.   No Known Allergies History   Social History  . Marital Status: Married    Spouse Name: N/A  . Number of Children: N/A  . Years of Education: N/A   Occupational History  . Not on file.   Social History Main Topics  . Smoking status: Never Smoker   . Smokeless tobacco: Not on file  . Alcohol Use: No  . Drug Use: No  . Sexual Activity: Not on file   Other Topics Concern  . Not on file   Social History Narrative      Review of Systems  All other systems reviewed and are negative.      Objective:   Physical Exam  Constitutional: She appears well-developed and well-nourished.  Cardiovascular: Normal rate and regular rhythm.   Pulmonary/Chest: Effort normal and breath sounds normal.  Musculoskeletal:       Left shoulder: She exhibits decreased range of motion and pain. She exhibits no tenderness, no bony tenderness,  no crepitus and normal strength.       Right ankle: She exhibits normal range of motion, no swelling, no ecchymosis, no deformity, no laceration and normal pulse. No tenderness. No lateral malleolus, no medial malleolus, no AITFL, no head of 5th metatarsal and no proximal fibula tenderness found. Achilles tendon exhibits no pain, no defect and normal Thompson's test results.  Vitals reviewed.         Assessment & Plan:  Sprain of ankle, right, initial encounter  Left shoulder pain   I believe the sprain of her ankle is healing appropriately. I believe she may have some residual scar tissue and inflammation  from the sprain. Therefore I've recommended physical therapy. She does not want to go to formal physical therapy. She would like to perform some exercises that I described her at home to see if she can improve the range of motion in her ankle an increase her flexibility. I would recommend formal physical therapy if no better. Using sterile technique, I injected the left shoulder with 2 mL of lidocaine, 2 mL of  Marcaine, and 2 mL of 40 mg per mL Kenalog.   Patient tolerated procedure well without complication.

## 2014-07-09 ENCOUNTER — Telehealth: Payer: Self-pay | Admitting: Family Medicine

## 2014-07-09 MED ORDER — DICLOFENAC SODIUM 75 MG PO TBEC: 75.0000 mg | DELAYED_RELEASE_TABLET | Freq: Two times a day (BID) | ORAL | Status: DC

## 2014-07-09 NOTE — Telephone Encounter (Signed)
Medication called/sent to requested pharmacy  

## 2014-07-09 NOTE — Telephone Encounter (Signed)
diclofenac (VOLTAREN) 75 MG EC tablet needs a refill sent into [facility] in [place].

## 2014-07-22 ENCOUNTER — Ambulatory Visit (INDEPENDENT_AMBULATORY_CARE_PROVIDER_SITE_OTHER): Payer: BLUE CROSS/BLUE SHIELD | Admitting: Family Medicine

## 2014-07-22 ENCOUNTER — Encounter: Payer: Self-pay | Admitting: Family Medicine

## 2014-07-22 VITALS — BP 150/74 | HR 82 | Temp 98.6°F | Resp 18 | Ht 61.5 in | Wt 175.0 lb

## 2014-07-22 DIAGNOSIS — F411 Generalized anxiety disorder: Secondary | ICD-10-CM | POA: Diagnosis not present

## 2014-07-22 MED ORDER — ALPRAZOLAM 0.5 MG PO TABS: 0.5000 mg | ORAL_TABLET | Freq: Three times a day (TID) | ORAL | Status: DC | PRN

## 2014-07-22 MED ORDER — SERTRALINE HCL 50 MG PO TABS: 50.0000 mg | ORAL_TABLET | Freq: Every day | ORAL | Status: DC

## 2014-07-22 NOTE — Progress Notes (Signed)
Subjective:    Patient ID: [name], female    DOB: 1950/05/23, 64 y.o.   MRN: [id number]  HPI Patient presents with diffuse itching. She blames it on anxiety. Patient is having panic attacks on a daily basis characterized by shaking uncontrollably. This stems from stress and having to care for her grandson. In the past, I have given the patient Prozac that the medication did not seem to be beneficial to the patient. She reports that she is tried Zoloft in the past and this worked well for her. She really would like to start Zoloft to try to help manage her generalized anxiety as well as control her depression. But she also needs something help manage the anxiety until his overall take affect. She denies any suicidal ideation or hallucinations or delusional behavior Past Medical History  Diagnosis Date  . Headache(784.0)   . Depression    Past Surgical History  Procedure Laterality Date  . Tracheostomy     Current Outpatient Prescriptions on File Prior to Visit  Medication Sig Dispense Refill  . Aspirin-Salicylamide-Caffeine (BC HEADACHE POWDER PO) Take 1 packet by mouth every 4 (four) hours as needed. Pain    . diphenhydrAMINE (BENADRYL) 25 mg capsule Take 25 mg by mouth every 6 (six) hours as needed.     No current facility-administered medications on file prior to visit.   No Known Allergies History   Social History  . Marital Status: Married    Spouse Name: N/A  . Number of Children: N/A  . Years of Education: N/A   Occupational History  . Not on file.   Social History Main Topics  . Smoking status: Never Smoker   . Smokeless tobacco: Not on file  . Alcohol Use: No  . Drug Use: No  . Sexual Activity: Not on file   Other Topics Concern  . Not on file   Social History Narrative      Review of Systems  All other systems reviewed and are negative.      Objective:   Physical Exam  Eyes: No scleral icterus.  Cardiovascular: Normal rate, regular rhythm  and normal heart sounds.   Pulmonary/Chest: Effort normal and breath sounds normal.  Abdominal: Soft. Bowel sounds are normal. She exhibits no distension. There is no tenderness. There is no rebound.  Skin: No rash noted.  Vitals reviewed.         Assessment & Plan:  Generalized anxiety disorder - Plan: sertraline (ZOLOFT) 50 MG tablet, ALPRAZolam (XANAX) 0.5 MG tablet  Begin Zoloft 50 mg by mouth daily at bedtime. Recheck in one month. Use Xanax 0.5 mg by mouth every 8 hours when necessary anxiety until medication takes full effect

## 2014-07-25 ENCOUNTER — Telehealth: Payer: Self-pay | Admitting: Family Medicine

## 2014-07-25 NOTE — Telephone Encounter (Signed)
Pt calling because new anxiety med was making her feel drunk.  After talking with her, she has been taking three times a day every day and taking with each meal. I explained to her that only take AS NEEDED when feeling anxious.  Try taking only 1/2 pill.  Can take up to three a day if she needs!!  York SpanielSaid not surprised she woozy after taking TID for past several days.  She acknowledged not understanding that.  Reinforced only take as needs and try only 1/2 tablet.  Let us know if still has any more problems.

## 2014-09-16 ENCOUNTER — Encounter: Payer: Self-pay | Admitting: Family Medicine

## 2014-10-07 ENCOUNTER — Encounter: Payer: Self-pay | Admitting: Family Medicine

## 2014-10-07 ENCOUNTER — Ambulatory Visit (INDEPENDENT_AMBULATORY_CARE_PROVIDER_SITE_OTHER): Payer: 59 | Admitting: Family Medicine

## 2014-10-07 DIAGNOSIS — IMO0001 Reserved for inherently not codable concepts without codable children: Secondary | ICD-10-CM

## 2014-10-07 DIAGNOSIS — F32A Depression, unspecified: Secondary | ICD-10-CM

## 2014-10-07 DIAGNOSIS — R030 Elevated blood-pressure reading, without diagnosis of hypertension: Secondary | ICD-10-CM

## 2014-10-07 DIAGNOSIS — F329 Major depressive disorder, single episode, unspecified: Secondary | ICD-10-CM

## 2014-10-07 MED ORDER — METOPROLOL SUCCINATE ER 50 MG PO TB24: 50.0000 mg | ORAL_TABLET | Freq: Every day | ORAL | Status: DC

## 2014-10-07 MED ORDER — ESCITALOPRAM OXALATE 10 MG PO TABS: 10.0000 mg | ORAL_TABLET | Freq: Every day | ORAL | Status: DC
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
## 2014-10-21 ENCOUNTER — Encounter: Payer: Self-pay | Admitting: Family Medicine

## 2014-10-31 ENCOUNTER — Ambulatory Visit (INDEPENDENT_AMBULATORY_CARE_PROVIDER_SITE_OTHER): Payer: 59 | Admitting: Physician Assistant

## 2014-10-31 ENCOUNTER — Encounter: Payer: Self-pay | Admitting: Physician Assistant

## 2014-10-31 VITALS — BP 148/80 | HR 68 | Temp 98.1°F | Resp 18 | Wt 175.0 lb

## 2014-10-31 DIAGNOSIS — H8110 Benign paroxysmal vertigo, unspecified ear: Secondary | ICD-10-CM

## 2014-10-31 MED ORDER — MECLIZINE HCL 25 MG PO TABS: 25.0000 mg | ORAL_TABLET | Freq: Three times a day (TID) | ORAL | Status: DC | PRN
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
## 2014-11-09 ENCOUNTER — Emergency Department (HOSPITAL_COMMUNITY)
Admission: EM | Admit: 2014-11-09 | Discharge: 2014-11-10 | Disposition: A | Discharge status: Home / Self Care | Payer: 59 | Attending: Emergency Medicine | Admitting: Emergency Medicine

## 2014-11-09 ENCOUNTER — Encounter (HOSPITAL_COMMUNITY): Payer: Self-pay

## 2014-11-09 DIAGNOSIS — R1319 Other dysphagia: Secondary | ICD-10-CM | POA: Insufficient documentation

## 2014-11-09 DIAGNOSIS — F329 Major depressive disorder, single episode, unspecified: Secondary | ICD-10-CM | POA: Diagnosis not present

## 2014-11-09 DIAGNOSIS — Y998 Other external cause status: Secondary | ICD-10-CM | POA: Insufficient documentation

## 2014-11-09 DIAGNOSIS — T18128A Food in esophagus causing other injury, initial encounter: Secondary | ICD-10-CM | POA: Diagnosis present

## 2014-11-09 DIAGNOSIS — Y92511 Restaurant or cafe as the place of occurrence of the external cause: Secondary | ICD-10-CM | POA: Insufficient documentation

## 2014-11-09 DIAGNOSIS — K210 Gastro-esophageal reflux disease with esophagitis: Secondary | ICD-10-CM | POA: Insufficient documentation

## 2014-11-09 DIAGNOSIS — K222 Esophageal obstruction: Secondary | ICD-10-CM | POA: Diagnosis not present

## 2014-11-09 DIAGNOSIS — K449 Diaphragmatic hernia without obstruction or gangrene: Secondary | ICD-10-CM | POA: Diagnosis not present

## 2014-11-09 DIAGNOSIS — Y9389 Activity, other specified: Secondary | ICD-10-CM | POA: Diagnosis not present

## 2014-11-09 DIAGNOSIS — X58XXXA Exposure to other specified factors, initial encounter: Secondary | ICD-10-CM | POA: Diagnosis not present

## 2014-11-09 DIAGNOSIS — Z79899 Other long term (current) drug therapy: Secondary | ICD-10-CM | POA: Insufficient documentation

## 2014-11-09 DIAGNOSIS — K2210 Ulcer of esophagus without bleeding: Secondary | ICD-10-CM | POA: Insufficient documentation

## 2014-11-09 DIAGNOSIS — K219 Gastro-esophageal reflux disease without esophagitis: Secondary | ICD-10-CM | POA: Diagnosis not present

## 2014-11-09 DIAGNOSIS — Q394 Esophageal web: Secondary | ICD-10-CM | POA: Diagnosis not present

## 2014-11-09 DIAGNOSIS — Z930 Tracheostomy status: Secondary | ICD-10-CM | POA: Diagnosis not present

## 2014-11-09 DIAGNOSIS — R1310 Dysphagia, unspecified: Secondary | ICD-10-CM | POA: Diagnosis not present

## 2014-11-09 DIAGNOSIS — T18108A Unspecified foreign body in esophagus causing other injury, initial encounter: Secondary | ICD-10-CM

## 2014-11-09 NOTE — ED Notes (Signed)
Hurts to swallow. Feels like something is stuck behind my scar.

## 2014-11-09 NOTE — ED Notes (Signed)
Pt states got something stuck in throat. Pt has no respiratory distress. Pt is able to speak & swallow w/o any complications.

## 2014-11-09 NOTE — ED Notes (Signed)
I went out to eat. I have something stuck in my throat, it will not go up or down.  I was eating soft bean burritos, but something got stuck per pt. I have was drinking tea to get it to go down and it would not go down, but the tea went down and I took 2 aleve before the paramedics got there.

## 2014-11-10 ENCOUNTER — Encounter (HOSPITAL_COMMUNITY)
Admission: EM | Disposition: A | Discharge status: Home / Self Care | Payer: Self-pay | Source: Home / Self Care | Attending: Emergency Medicine

## 2014-11-10 ENCOUNTER — Encounter (HOSPITAL_COMMUNITY): Payer: Self-pay | Admitting: *Deleted

## 2014-11-10 DIAGNOSIS — K449 Diaphragmatic hernia without obstruction or gangrene: Secondary | ICD-10-CM | POA: Diagnosis not present

## 2014-11-10 DIAGNOSIS — R1310 Dysphagia, unspecified: Secondary | ICD-10-CM

## 2014-11-10 DIAGNOSIS — Q394 Esophageal web: Secondary | ICD-10-CM | POA: Diagnosis not present

## 2014-11-10 DIAGNOSIS — K210 Gastro-esophageal reflux disease with esophagitis: Secondary | ICD-10-CM | POA: Diagnosis not present

## 2014-11-10 DIAGNOSIS — K222 Esophageal obstruction: Secondary | ICD-10-CM | POA: Insufficient documentation

## 2014-11-10 DIAGNOSIS — K219 Gastro-esophageal reflux disease without esophagitis: Secondary | ICD-10-CM | POA: Insufficient documentation

## 2014-11-10 HISTORY — PX: ESOPHAGOGASTRODUODENOSCOPY: SHX5428

## 2014-11-10 SURGERY — EGD (ESOPHAGOGASTRODUODENOSCOPY)
Anesthesia: Moderate Sedation

## 2014-11-10 MED ORDER — ONDANSETRON HCL 4 MG/2ML IJ SOLN
INTRAMUSCULAR | Status: AC
  Filled 2014-11-10: qty 2

## 2014-11-10 MED ORDER — MEPERIDINE HCL 100 MG/ML IJ SOLN
INTRAMUSCULAR | Status: DC | PRN
  Administered 2014-11-10 (×2): 25 mg via INTRAVENOUS

## 2014-11-10 MED ORDER — STERILE WATER FOR IRRIGATION IR SOLN
Status: DC | PRN
  Administered 2014-11-10: 02:00:00

## 2014-11-10 MED ORDER — SODIUM CHLORIDE 0.9 % IV SOLN
INTRAVENOUS | Status: DC
  Administered 2014-11-10: 1000 mL via INTRAVENOUS

## 2014-11-10 MED ORDER — LIDOCAINE VISCOUS 2 % MT SOLN
OROMUCOSAL | Status: DC | PRN
  Administered 2014-11-10: 1 via OROMUCOSAL

## 2014-11-10 MED ORDER — ONDANSETRON HCL 4 MG/2ML IJ SOLN
INTRAMUSCULAR | Status: DC | PRN
  Administered 2014-11-10: 4 mg via INTRAVENOUS

## 2014-11-10 MED ORDER — MEPERIDINE HCL 100 MG/ML IJ SOLN
INTRAMUSCULAR | Status: AC
  Filled 2014-11-10: qty 2

## 2014-11-10 MED ORDER — MIDAZOLAM HCL 5 MG/5ML IJ SOLN
INTRAMUSCULAR | Status: AC
  Filled 2014-11-10: qty 10

## 2014-11-10 MED ORDER — LIDOCAINE VISCOUS 2 % MT SOLN
OROMUCOSAL | Status: AC
  Filled 2014-11-10: qty 15

## 2014-11-10 MED ORDER — MIDAZOLAM HCL 5 MG/5ML IJ SOLN
INTRAMUSCULAR | Status: DC | PRN
  Administered 2014-11-10: 2 mg via INTRAVENOUS
  Administered 2014-11-10: 1 mg via INTRAVENOUS

## 2014-11-10 NOTE — Discharge Instructions (Signed)
EGD Discharge instructions Please read the instructions outlined below and refer to this sheet in the next few weeks. These discharge instructions provide you with general information on caring for yourself after you leave the hospital. Your doctor may also give you specific instructions. While your treatment has been planned according to the most current medical practices available, unavoidable complications occasionally occur. If you have any problems or questions after discharge, please call your doctor. ACTIVITY  You may resume your regular activity but move at a slower pace for the next 24 hours.   Take frequent rest periods for the next 24 hours.   Walking will help expel (get rid of) the air and reduce the bloated feeling in your abdomen.   No driving for 24 hours (because of the anesthesia (medicine) used during the test).   You may shower.   Do not sign any important legal documents or operate any machinery for 24 hours (because of the anesthesia used during the test).  NUTRITION  Drink plenty of fluids.   You may resume your normal diet.   Begin with a light meal and progress to your normal diet.   Avoid alcoholic beverages for 24 hours or as instructed by your caregiver.  MEDICATIONS  You may resume your normal medications unless your caregiver tells you otherwise.  WHAT YOU CAN EXPECT TODAY  You may experience abdominal discomfort such as a feeling of fullness or gas pains.  FOLLOW-UP  Your doctor will discuss the results of your test with you.  SEEK IMMEDIATE MEDICAL ATTENTION IF ANY OF THE FOLLOWING OCCUR:  Excessive nausea (feeling sick to your stomach) and/or vomiting.   Severe abdominal pain and distention (swelling).   Trouble swallowing.   Temperature over 101 F (37.8 C).   Rectal bleeding or vomiting of blood.     GERD and hiatal hernia information provided  Begin Protonix 40 mg daily  See dentist about getting better Fitting  dentures.  Chopped meats / swallowing precautions reviewed  My office will call you to schedule a repeat EGD so your esophagus can be dilated.   Gastroesophageal Reflux Disease, Adult Normally, food travels down the esophagus and stays in the stomach to be digested. However, when a person has gastroesophageal reflux disease (GERD), food and stomach acid move back up into the esophagus. When this happens, the esophagus becomes sore and inflamed. Over time, GERD can create small holes (ulcers) in the lining of the esophagus.  CAUSES This condition is caused by a problem with the muscle between the esophagus and the stomach (lower esophageal sphincter, or LES). Normally, the LES muscle closes after food passes through the esophagus to the stomach. When the LES is weakened or abnormal, it does not close properly, and that allows food and stomach acid to go back up into the esophagus. The LES can be weakened by certain dietary substances, medicines, and medical conditions, including:  Tobacco use.  Pregnancy.  Having a hiatal hernia.  Heavy alcohol use.  Certain foods and beverages, such as coffee, chocolate, onions, and peppermint. RISK FACTORS This condition is more likely to develop in:  People who have an increased body weight.  People who have connective tissue disorders.  People who use NSAID medicines. SYMPTOMS Symptoms of this condition include:  Heartburn.  Difficult or painful swallowing.  The feeling of having a lump in the throat.  Abitter taste in the mouth.  Bad breath.  Having a large amount of saliva.  Having an upset or bloated  stomach.  Belching.  Chest pain.  Shortness of breath or wheezing.  Ongoing (chronic) cough or a night-time cough.  Wearing away of tooth enamel.  Weight loss. Different conditions can cause chest pain. Make sure to see your health care provider if you experience chest pain. DIAGNOSIS Your health care provider will take a  medical history and perform a physical exam. To determine if you have mild or severe GERD, your health care provider may also monitor how you respond to treatment. You may also have other tests, including:  An endoscopy toexamine your stomach and esophagus with a small camera.  A test thatmeasures the acidity level in your esophagus.  A test thatmeasures how much pressure is on your esophagus.  A barium swallow or modified barium swallow to show the shape, size, and functioning of your esophagus. TREATMENT The goal of treatment is to help relieve your symptoms and to prevent complications. Treatment for this condition may vary depending on how severe your symptoms are. Your health care provider may recommend:  Changes to your diet.  Medicine.  Surgery. HOME CARE INSTRUCTIONS Diet  Follow a diet as recommended by your health care provider. This may involve avoiding foods and drinks such as:  Coffee and tea (with or without caffeine).  Drinks that containalcohol.  Energy drinks and sports drinks.  Carbonated drinks or sodas.  Chocolate and cocoa.  Peppermint and mint flavorings.  Garlic and onions.  Horseradish.  Spicy and acidic foods, including peppers, chili powder, curry powder, vinegar, hot sauces, and barbecue sauce.  Citrus fruit juices and citrus fruits, such as oranges, lemons, and limes.  Tomato-based foods, such as red sauce, chili, salsa, and pizza with red sauce.  Fried and fatty foods, such as donuts, french fries, potato chips, and high-fat dressings.  High-fat meats, such as hot dogs and fatty cuts of red and white meats, such as rib eye steak, sausage, ham, and bacon.  High-fat dairy items, such as whole milk, butter, and cream cheese.  Eat small, frequent meals instead of large meals.  Avoid drinking large amounts of liquid with your meals.  Avoid eating meals during the 2-3 hours before bedtime.  Avoid lying down right after you eat.  Do  not exercise right after you eat. General Instructions  Pay attention to any changes in your symptoms.  Take over-the-counter and prescription medicines only as told by your health care provider. Do not take aspirin, ibuprofen, or other NSAIDs unless your health care provider told you to do so.  Do not use any tobacco products, including cigarettes, chewing tobacco, and e-cigarettes. If you need help quitting, ask your health care provider.  Wear loose-fitting clothing. Do not wear anything tight around your waist that causes pressure on your abdomen.  Raise (elevate) the head of your bed 6 inches (15cm).  Try to reduce your stress, such as with yoga or meditation. If you need help reducing stress, ask your health care provider.  If you are overweight, reduce your weight to an amount that is healthy for you. Ask your health care provider for guidance about a safe weight loss goal.  Keep all follow-up visits as told by your health care provider. This is important. SEEK MEDICAL CARE IF:  You have new symptoms.  You have unexplained weight loss.  You have difficulty swallowing, or it hurts to swallow.  You have wheezing or a persistent cough.  Your symptoms do not improve with treatment.  You have a hoarse voice. SEEK IMMEDIATE  MEDICAL CARE IF:  You have pain in your arms, neck, jaw, teeth, or back.  You feel sweaty, dizzy, or light-headed.  You have chest pain or shortness of breath.  You vomit and your vomit looks like blood or coffee grounds.  You faint.  Your stool is bloody or black.  You cannot swallow, drink, or eat.   This information is not intended to replace advice given to you by your health care provider. Make sure you discuss any questions you have with your health care provider.   Hiatal Hernia A hiatal hernia occurs when part of your stomach slides above the muscle that separates your abdomen from your chest (diaphragm). You can be born with a hiatal  hernia (congenital), or it may develop over time. In almost all cases of hiatal hernia, only the top part of the stomach pushes through.  Many people have a hiatal hernia with no symptoms. The larger the hernia, the more likely that you will have symptoms. In some cases, a hiatal hernia allows stomach acid to flow back into the tube that carries food from your mouth to your stomach (esophagus). This may cause heartburn symptoms. Severe heartburn symptoms may mean you have developed a condition called gastroesophageal reflux disease (GERD).  CAUSES  Hiatal hernias are caused by a weakness in the opening (hiatus) where your esophagus passes through your diaphragm to attach to the upper part of your stomach. You may be born with a weakness in your hiatus, or a weakness can develop. RISK FACTORS Older age is a major risk factor for a hiatal hernia. Anything that increases pressure on your diaphragm can also increase your risk of a hiatal hernia. This includes:  Pregnancy.  Excess weight.  Frequent constipation. SIGNS AND SYMPTOMS  People with a hiatal hernia often have no symptoms. If symptoms develop, they are almost always caused by GERD. They may include:  Heartburn.  Belching.  Indigestion.  Trouble swallowing.  Coughing or wheezing.  Sore throat.  Hoarseness.  Chest pain. DIAGNOSIS  A hiatal hernia is sometimes found during an exam for another problem. Your health care provider may suspect a hiatal hernia if you have symptoms of GERD. Tests may be done to diagnose GERD. These may include:  X-rays of your stomach or chest.  An upper gastrointestinal (GI) series. This is an X-ray exam of your GI tract involving the use of a chalky liquid that you swallow. The liquid shows up clearly on the X-ray.  Endoscopy. This is a procedure to look into your stomach using a thin, flexible tube that has a tiny camera and light on the end of it. TREATMENT  If you have no symptoms, you may not  need treatment. If you have symptoms, treatment may include:  Dietary and lifestyle changes to help reduce GERD symptoms.  Medicines. These may include:  Over-the-counter antacids.  Medicines that make your stomach empty more quickly.  Medicines that block the production of stomach acid (H2 blockers).  Stronger medicines to reduce stomach acid (proton pump inhibitors).  You may need surgery to repair the hernia if other treatments are not helping. HOME CARE INSTRUCTIONS   Take all medicines as directed by your health care provider.  Quit smoking, if you smoke.  Try to achieve and maintain a healthy body weight.  Eat frequent small meals instead of three large meals a day. This keeps your stomach from getting too full.  Eat slowly.  Do not lie down right after eating.  Do  noteat 1-2 hours before bed.   Do not drink beverages with caffeine. These include cola, coffee, cocoa, and tea.  Do not drink alcohol.  Avoid foods that can make symptoms of GERD worse. These may include:  Fatty foods.  Citrus fruits.  Other foods and drinks that contain acid.  Avoid putting pressure on your belly. Anything that puts pressure on your belly increases the amount of acid that may be pushed up into your esophagus.   Avoid bending over, especially after eating.  Raise the head of your bed by putting blocks under the legs. This keeps your head and esophagus higher than your stomach.  Do not wear tight clothing around your chest or stomach.  Try not to strain when having a bowel movement, when urinating, or when lifting heavy objects. SEEK MEDICAL CARE IF:  Your symptoms are not controlled with medicines or lifestyle changes.  You are having trouble swallowing.  You have coughing or wheezing that will not go away. SEEK IMMEDIATE MEDICAL CARE IF:  Your pain is getting worse.  Your pain spreads to your arms, neck, jaw, teeth, or back.  You have shortness of breath.  You  sweat for no reason.  You feel sick to your stomach (nauseous) or vomit.  You vomit blood.  You have bright red blood in your stools.  You have black, tarry stools.    This information is not intended to replace advice given to you by your health care provider. Make sure you discuss any questions you have with your health care provider.
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
## 2014-11-10 NOTE — Op Note (Signed)
[facility] [address] [place], [id number]   ENDOSCOPY PROCEDURE REPORT  PATIENT: [name], [name]  MR#: [id number] BIRTHDATE: [birth year] , 64  yrs. old GENDER: female ENDOSCOPIST: R.  Roetta SessionsMichael Laurance Heide, MD FACP FACG REFERRED BY:  Lynnea FerrierWarren Pickard, M.D. PROCEDURE DATE:  11/10/2014 PROCEDURE:  EGD, diagnostic INDICATIONS:  Esophageal food impaction. MEDICATIONS: Versed 3 mg IV and Demerol 50 mg IV in divided doses. Xylocaine gel orally.  Zofran 4 mg IV. ASA CLASS:      Class II  CONSENT: The risks, benefits, limitations, alternatives and imponderables have been discussed.  The potential for biopsy, esophogeal dilation, etc. have also been reviewed.  Questions have been answered.  All parties agreeable.  Please see the history and physical in the medical record for more information.  DESCRIPTION OF PROCEDURE: After the risks benefits and alternatives of the procedure were thoroughly explained, informed consent was obtained.  The EG-2990i ([id number]) endoscope was introduced through the mouth and advanced to the second portion of the duodenum , limited by Without limitations. The instrument was slowly withdrawn as the mucosa was fully examined. Estimated blood loss is zero unless otherwise noted in this procedure report.    Prominent Schatzki's ring with overlying distal esophageal erosions. No Barrett's esophagus or tumor.  EGD junction easily traversed. 3 cm hiatal hernia.  Large amount of solid semi-formed food in the hiatal hernia sac.  Deformity of the antrum/prepyloric mucosa with scar present.  No obvious infiltrating process or active ulcer seen however, stomach not empty.  Pylorus patent.  Examination first and second portion of the duodenum revealed no abnormalities.  No dilation performed per plan.  Retroflexed views revealed as previously described.     The scope was then withdrawn from the patient and the procedure completed.  COMPLICATIONS: There  were no immediate complications.  ENDOSCOPIC IMPRESSION: Prominent Schatzki's ring. Erosive reflux esophagitis. Food debris in hernia sac -  likely representing recently passed food bolus. Deformity and scar in the antrum suggestive of prior peptic ulcer disease.  RECOMMENDATIONS: Begin Protonix 40 mg daily. See dentist about getting dentures fixed. Swallowing precautions reviewed. We will get her scheduled back for elective EGD with esophageal dilation.  REPEAT EXAM:  eSigned:  R. Roetta SessionsMichael Reign Bartnick, MD Jerrel IvoryFACP [facility]wayFACG 11/10/2014 2:31 AM    CC:  CPT CODES: ICD CODES:  The ICD and CPT codes recommended by this software are interpretations from the data that the clinical staff has captured with the software.  The verification of the translation of this report to the ICD and CPT codes and modifiers is the sole responsibility of the health care institution and practicing physician where this report was generated.  [employer] will not be held responsible for the validity of the ICD and CPT codes included on this report.  AMA assumes no liability for data contained or not contained herein. CPT is a Publishing rights managerregistered trademark of the [employer].  PATIENT NAME:  [name], Perrin Haynes MR#: [id number]

## 2014-11-11 ENCOUNTER — Telehealth: Payer: Self-pay

## 2014-11-11 NOTE — Telephone Encounter (Signed)
Called pt and LMOM for pt to call back so we could get this set up for her.

## 2014-11-11 NOTE — Telephone Encounter (Signed)
-----   Message from Corbin Adeobert M Rourk, MD sent at 11/10/2014  2:31 AM EDT ----- Patient needs to be scheduled for elective EGD with esophageal dilation within the next 4 weeks. If done within the next 30 days no new office visit needed as patient was seen in the hospital early this morning and has an H&P.

## 2014-11-14 ENCOUNTER — Other Ambulatory Visit: Payer: Self-pay

## 2014-11-14 DIAGNOSIS — K219 Gastro-esophageal reflux disease without esophagitis: Secondary | ICD-10-CM

## 2014-11-14 DIAGNOSIS — T18108D Unspecified foreign body in esophagus causing other injury, subsequent encounter: Secondary | ICD-10-CM

## 2014-11-14 NOTE — Telephone Encounter (Signed)
Called and spoke with pt. States she has to talk with her husband first and she will call us back

## 2014-11-14 NOTE — Telephone Encounter (Signed)
Pt called back and agreed with RMR about doing the EGD/ED. Pt is set for 11/20/2014 @ 0930.

## 2014-11-19 ENCOUNTER — Telehealth: Payer: Self-pay

## 2014-11-19 NOTE — Telephone Encounter (Signed)
PER [employer] PA # is [id number]

## 2014-11-20 ENCOUNTER — Encounter (HOSPITAL_COMMUNITY): Payer: Self-pay | Admitting: *Deleted

## 2014-11-20 ENCOUNTER — Encounter (HOSPITAL_COMMUNITY)
Admission: RE | Disposition: A | Discharge status: Home / Self Care | Payer: Self-pay | Source: Ambulatory Visit | Attending: Internal Medicine

## 2014-11-20 ENCOUNTER — Ambulatory Visit (HOSPITAL_COMMUNITY)
Admission: RE | Admit: 2014-11-20 | Discharge: 2014-11-20 | Disposition: A | Discharge status: Home / Self Care | Payer: 59 | Source: Ambulatory Visit | Attending: Internal Medicine | Admitting: Internal Medicine

## 2014-11-20 DIAGNOSIS — F329 Major depressive disorder, single episode, unspecified: Secondary | ICD-10-CM | POA: Diagnosis not present

## 2014-11-20 DIAGNOSIS — K259 Gastric ulcer, unspecified as acute or chronic, without hemorrhage or perforation: Secondary | ICD-10-CM | POA: Diagnosis not present

## 2014-11-20 DIAGNOSIS — K222 Esophageal obstruction: Secondary | ICD-10-CM | POA: Insufficient documentation

## 2014-11-20 DIAGNOSIS — T18108D Unspecified foreign body in esophagus causing other injury, subsequent encounter: Secondary | ICD-10-CM

## 2014-11-20 DIAGNOSIS — R1310 Dysphagia, unspecified: Secondary | ICD-10-CM | POA: Diagnosis not present

## 2014-11-20 DIAGNOSIS — Q394 Esophageal web: Secondary | ICD-10-CM

## 2014-11-20 DIAGNOSIS — R1319 Other dysphagia: Secondary | ICD-10-CM | POA: Diagnosis not present

## 2014-11-20 DIAGNOSIS — Z79899 Other long term (current) drug therapy: Secondary | ICD-10-CM | POA: Insufficient documentation

## 2014-11-20 DIAGNOSIS — K219 Gastro-esophageal reflux disease without esophagitis: Secondary | ICD-10-CM

## 2014-11-20 DIAGNOSIS — K449 Diaphragmatic hernia without obstruction or gangrene: Secondary | ICD-10-CM | POA: Diagnosis not present

## 2014-11-20 HISTORY — PX: ESOPHAGOGASTRODUODENOSCOPY: SHX5428

## 2014-11-20 HISTORY — DX: Gastro-esophageal reflux disease without esophagitis: K21.9

## 2014-11-20 HISTORY — DX: Essential (primary) hypertension: I10

## 2014-11-20 HISTORY — PX: ESOPHAGEAL DILATION: SHX303

## 2014-11-20 HISTORY — DX: Dysphagia, unspecified: R13.10

## 2014-11-20 SURGERY — EGD (ESOPHAGOGASTRODUODENOSCOPY)
Anesthesia: Moderate Sedation

## 2014-11-20 MED ORDER — MIDAZOLAM HCL 5 MG/5ML IJ SOLN
INTRAMUSCULAR | Status: DC | PRN
  Administered 2014-11-20: 2 mg via INTRAVENOUS
  Administered 2014-11-20: 1 mg via INTRAVENOUS

## 2014-11-20 MED ORDER — LIDOCAINE VISCOUS 2 % MT SOLN
OROMUCOSAL | Status: DC | PRN
  Administered 2014-11-20: 1 via OROMUCOSAL

## 2014-11-20 MED ORDER — MEPERIDINE HCL 100 MG/ML IJ SOLN
INTRAMUSCULAR | Status: AC
  Filled 2014-11-20: qty 2

## 2014-11-20 MED ORDER — SODIUM CHLORIDE 0.9 % IV SOLN
INTRAVENOUS | Status: DC
Start: 2014-11-20 — End: 2014-11-20
  Administered 2014-11-20: 10:00:00 via INTRAVENOUS

## 2014-11-20 MED ORDER — MEPERIDINE HCL 100 MG/ML IJ SOLN
INTRAMUSCULAR | Status: DC | PRN
  Administered 2014-11-20: 50 mg
  Administered 2014-11-20: 25 mg

## 2014-11-20 MED ORDER — ONDANSETRON HCL 4 MG/2ML IJ SOLN
INTRAMUSCULAR | Status: AC
  Filled 2014-11-20: qty 2

## 2014-11-20 MED ORDER — LIDOCAINE VISCOUS 2 % MT SOLN
OROMUCOSAL | Status: AC
  Filled 2014-11-20: qty 15

## 2014-11-20 MED ORDER — ONDANSETRON HCL 4 MG/2ML IJ SOLN
INTRAMUSCULAR | Status: DC | PRN
  Administered 2014-11-20: 4 mg via INTRAVENOUS

## 2014-11-20 MED ORDER — MIDAZOLAM HCL 5 MG/5ML IJ SOLN
INTRAMUSCULAR | Status: AC
  Filled 2014-11-20: qty 10

## 2014-11-20 NOTE — Discharge Instructions (Signed)
EGD Discharge instructions Please read the instructions outlined below and refer to this sheet in the next few weeks. These discharge instructions provide you with general information on caring for yourself after you leave the hospital. Your doctor may also give you specific instructions. While your treatment has been planned according to the most current medical practices available, unavoidable complications occasionally occur. If you have any problems or questions after discharge, please call your doctor. ACTIVITY  You may resume your regular activity but move at a slower pace for the next 24 hours.   Take frequent rest periods for the next 24 hours.   Walking will help expel (get rid of) the air and reduce the bloated feeling in your abdomen.   No driving for 24 hours (because of the anesthesia (medicine) used during the test).   You may shower.   Do not sign any important legal documents or operate any machinery for 24 hours (because of the anesthesia used during the test).  NUTRITION  Drink plenty of fluids.   You may resume your normal diet.   Begin with a light meal and progress to your normal diet.   Avoid alcoholic beverages for 24 hours or as instructed by your caregiver.  MEDICATIONS  You may resume your normal medications unless your caregiver tells you otherwise.  WHAT YOU CAN EXPECT TODAY  You may experience abdominal discomfort such as a feeling of fullness or gas pains.  FOLLOW-UP  Your doctor will discuss the results of your test with you.  SEEK IMMEDIATE MEDICAL ATTENTION IF ANY OF THE FOLLOWING OCCUR:  Excessive nausea (feeling sick to your stomach) and/or vomiting.   Severe abdominal pain and distention (swelling).   Trouble swallowing.   Temperature over 101 F (37.8 C).   Rectal bleeding or vomiting of blood.    GERD and peptic ulcer disease information provided  Increase Protonix to 40 mg twice daily  Office visit with [place] in 12  weeks  Further recommendations to follow pending review of pathology report  Avoid nonsteroidal agents likes Advil and AleveHiatal Hernia A hiatal hernia occurs when part of your stomach slides above the muscle that separates your abdomen from your chest (diaphragm). You can be born with a hiatal hernia (congenital), or it may develop over time. In almost all cases of hiatal hernia, only the top part of the stomach pushes through.  Many people have a hiatal hernia with no symptoms. The larger the hernia, the more likely that you will have symptoms. In some cases, a hiatal hernia allows stomach acid to flow back into the tube that carries food from your mouth to your stomach (esophagus). This may cause heartburn symptoms. Severe heartburn symptoms may mean you have developed a condition called gastroesophageal reflux disease (GERD).  CAUSES  Hiatal hernias are caused by a weakness in the opening (hiatus) where your esophagus passes through your diaphragm to attach to the upper part of your stomach. You may be born with a weakness in your hiatus, or a weakness can develop. RISK FACTORS Older age is a major risk factor for a hiatal hernia. Anything that increases pressure on your diaphragm can also increase your risk of a hiatal hernia. This includes:  Pregnancy.  Excess weight.  Frequent constipation. SIGNS AND SYMPTOMS  People with a hiatal hernia often have no symptoms. If symptoms develop, they are almost always caused by GERD. They may include:  Heartburn.  Belching.  Indigestion.  Trouble swallowing.  Coughing or wheezing.  Sore  throat.  Hoarseness.  Chest pain. DIAGNOSIS  A hiatal hernia is sometimes found during an exam for another problem. Your health care provider may suspect a hiatal hernia if you have symptoms of GERD. Tests may be done to diagnose GERD. These may include:  X-rays of your stomach or chest.  An upper gastrointestinal (GI) series. This is an X-ray exam  of your GI tract involving the use of a chalky liquid that you swallow. The liquid shows up clearly on the X-ray.  Endoscopy. This is a procedure to look into your stomach using a thin, flexible tube that has a tiny camera and light on the end of it. TREATMENT  If you have no symptoms, you may not need treatment. If you have symptoms, treatment may include:  Dietary and lifestyle changes to help reduce GERD symptoms.  Medicines. These may include:  Over-the-counter antacids.  Medicines that make your stomach empty more quickly.  Medicines that block the production of stomach acid (H2 blockers).  Stronger medicines to reduce stomach acid (proton pump inhibitors).  You may need surgery to repair the hernia if other treatments are not helping. HOME CARE INSTRUCTIONS   Take all medicines as directed by your health care provider.  Quit smoking, if you smoke.  Try to achieve and maintain a healthy body weight.  Eat frequent small meals instead of three large meals a day. This keeps your stomach from getting too full.  Eat slowly.  Do not lie down right after eating.  Do noteat 1-2 hours before bed.   Do not drink beverages with caffeine. These include cola, coffee, cocoa, and tea.  Do not drink alcohol.  Avoid foods that can make symptoms of GERD worse. These may include:  Fatty foods.  Citrus fruits.  Other foods and drinks that contain acid.  Avoid putting pressure on your belly. Anything that puts pressure on your belly increases the amount of acid that may be pushed up into your esophagus.   Avoid bending over, especially after eating.  Raise the head of your bed by putting blocks under the legs. This keeps your head and esophagus higher than your stomach.  Do not wear tight clothing around your chest or stomach.  Try not to strain when having a bowel movement, when urinating, or when lifting heavy objects. SEEK MEDICAL CARE IF:  Your symptoms are not  controlled with medicines or lifestyle changes.  You are having trouble swallowing.  You have coughing or wheezing that will not go away. SEEK IMMEDIATE MEDICAL CARE IF:  Your pain is getting worse.  Your pain spreads to your arms, neck, jaw, teeth, or back.  You have shortness of breath.  You sweat for no reason.  You feel sick to your stomach (nauseous) or vomit.  You vomit blood.  You have bright red blood in your stools.  You have black, tarry stools.    This information is not intended to replace advice given to you by your health care provider. Make sure you discuss any questions you have with your health care provider.   Document Released: 03/20/2003 Document Revised: 01/18/2014 Document Reviewed: 12/15/2012 Elsevier Interactive Patient Education 2016 Elsevier Inc. Esophageal Stricture Esophageal stricture is a condition that causes the esophagus to become narrow. The esophagus is the long tube in your throat that carries food and liquid from your mouth to your stomach. Esophageal stricture can make it difficult, painful, or even impossible to swallow. The condition also makes choking more likely.  CAUSES  Gastroesophageal reflux disease (GERD) is the most common cause of esophageal stricture. In GERD, stomach acid backs up into the esophagus. Over time, this causes scar tissue and leads to narrowing (stricture). Other causes of esophageal stricture include:  Scarring from ingesting a harmful substance.  Damage from medical instruments used in the esophagus.  Radiation therapy.  Cancer. RISK FACTORS You are at greater risk for esophageal stricture if you have GERD or esophageal cancer. SIGNS AND SYMPTOMS  Signs and symptoms of esophageal stricture include:  Difficulty swallowing.  Pain when swallowing.  Heartburn.  Vomiting or spitting up (regurgitating) food or liquids.  Weight loss.  DIAGNOSIS  Your health care provider may suspect esophageal  stricture based on your symptoms. A physical exam will also be done. You may need tests to confirm the diagnosis. These can include:  Upper endoscopy. Your health care provider will insert a flexible tube with a tiny camera on it (endoscope) into your esophagus to check for a stricture. A tissue sample may also be taken to be examined under a microscope (biopsy).  Esophageal pH monitoring. This test involves collecting acid in the esophagus with a tube to determine how much stomach acid is entering the esophagus.  Barium swallow test. For this test, you will drink a barium solution that coats the lining of the esophagus. Then you will have an X-ray taken. The barium solution helps to show if there is stricture. TREATMENT Treatment for esophageal stricture depends on what is causing your condition and how severe it is. Treatment options include:  Esophageal dilatation. In this procedure, a health care provider inserts an endoscope or a tool called a dilator into your esophagus to gently stretch it and make the opening wider.  Stents. In some cases, your health care provider may place a small device (stent) in the esophagus to keep it open.  Acid-blocking medicines. Taking these helps manage GERD symptoms after an esophageal stricture. This can prevent the stricture from returning. HOME CARE INSTRUCTIONS  Do not drink alcohol.  Do not use any tobacco products, including cigarettes, chewing tobacco, or electronic cigarettes. If you need help quitting, ask your health care provider.  Lose weight if you are overweight.  Wear loose, comfortable clothing.  Do not eat for 3 hours before bedtime.  Elevate your head in bed with pillows.  Do not overeat at meals.  Do not eat foods that make reflux worse. These include:  Fatty foods.  Spicy foods.  Soda.  Tomato products.  Chocolate. SEEK MEDICAL CARE IF:  You have problems eating or swallowing.  You regurgitate food and  liquid. MAKE SURE YOU:  Understand these instructions.  Will watch your condition.  Will get help right away if you are not doing well or get worse.   This information is not intended to replace advice given to you by your health care provider. Make sure you discuss any questions you have with your health care provider.   Document Released: 09/07/2005 Document Revised: 01/18/2014 Document Reviewed: 05/09/2013 Elsevier Interactive Patient Education [employer]
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
## 2014-11-20 NOTE — Interval H&P Note (Signed)
History and Physical Interval Note:  11/20/2014 10:15 AM  [name]  has presented today for surgery, with the diagnosis of GERD,  history of  esophageal foreign body  The various methods of treatment have been discussed with the patient and family. After consideration of risks, benefits and other options for treatment, the patient has consented to  Procedure(s) with comments: ESOPHAGOGASTRODUODENOSCOPY (EGD) (N/A) - 0930 ESOPHAGEAL DILATION (N/A) as a surgical intervention .  The patient's history has been reviewed, patient examined, no change in status, stable for surgery.  I have reviewed the patient's chart and labs.  Questions were answered to the patient's satisfaction.     [name]  Patient passed the food bolus on 10/30 just prior to EGD. Patient found to have a Schatzki's ring. Has been doing well since that time. No change otherwise. She's here for the elective EGD with esophageal dilation as planned. Please see my 11/10/14 H&P.  The risks, benefits, limitations, alternatives and imponderables have been reviewed with the patient. Potential for esophageal dilation, biopsy, etc. have also been reviewed.  Questions have been answered. All parties agreeable.

## 2014-11-20 NOTE — Op Note (Signed)
[facility] [address] [place], [id number]   ENDOSCOPY PROCEDURE REPORT  PATIENT: [name], [name]  MR#: [id number] BIRTHDATE: 14-Jan-1950 , 64  yrs. old GENDER: female ENDOSCOPIST: R.  Roetta SessionsMichael Conan Mcmanaway, MD FACP FACG REFERRED BY:  Lynnea FerrierWarren Pickard, M.D. PROCEDURE DATE:  11/20/2014 PROCEDURE:  EGD w/ biopsy and Maloney dilation of esophagus INDICATIONS:  Esophageal dysphagia; recent esophageal food impaction. MEDICATIONS: Versed 3 mg IV and 75 mg IV in divided doses. Xylocaine gel orally.  Zofran 4 mg IV. ASA CLASS:      Class III  CONSENT: The risks, benefits, limitations, alternatives and imponderables have been discussed.  The potential for biopsy, esophogeal dilation, etc. have also been reviewed.  Questions have been answered.  All parties agreeable.  Please see the history and physical in the medical record for more information.  DESCRIPTION OF PROCEDURE: After the risks benefits and alternatives of the procedure were thoroughly explained, informed consent was obtained.  The EG-2990i ([id number]) endoscope was introduced through the mouth and advanced to the second portion of the duodenum , limited by Without limitations. The instrument was slowly withdrawn as the mucosa was fully examined. Estimated blood loss is zero unless otherwise noted in this procedure report.    Prominent Schatzki's ring present; otherwise normal esophagus.  EG junction easily traversed with the scope Stomach empty.  Small hiatal hernia.  Prepyloric antral erosions and (1) 8 mm prepyloric ulcer.  Pylorus patent.  The first and second portion of the duodenum appeared normal.  Scope was removed and a 54 [place] Maloney dilator was passed to full insertion easily.  A look back revealed a superficial tear through the UES and the ring was noted to have been ruptured without apparent complication and with minimal bleeding.  Finally, prepyloric ulcer was biopsied for histologic study.   Retroflexed views revealed as previously described.     The scope was then withdrawn from the patient and the procedure completed.  COMPLICATIONS: There were no immediate complications.  ENDOSCOPIC IMPRESSION: Schatzki's ring?"status post dilation as described above. Hiatal hernia. Prepyloric gastric ulcer?"status post biopsy  RECOMMENDATIONS: Increase Protonix to 40 mg twice daily. Office visit with us in 12 weeks. Follow up on pathology.  REPEAT EXAM:  eSigned:  R. Roetta SessionsMichael Samreen Seltzer, MD Jerrel IvoryFACP [facility] CenterFACG 11/20/2014 10:51 AM    CC:  CPT CODES: ICD CODES:  The ICD and CPT codes recommended by this software are interpretations from the data that the clinical staff has captured with the software.  The verification of the translation of this report to the ICD and CPT codes and modifiers is the sole responsibility of the health care institution and practicing physician where this report was generated.  [employer] will not be held responsible for the validity of the ICD and CPT codes included on this report.  AMA assumes no liability for data contained or not contained herein. CPT is a Publishing rights managerregistered trademark of the [employer].

## 2014-11-21 ENCOUNTER — Encounter: Payer: Self-pay | Admitting: Internal Medicine

## 2014-11-22 ENCOUNTER — Encounter: Payer: Self-pay | Admitting: Internal Medicine

## 2014-11-22 ENCOUNTER — Telehealth: Payer: Self-pay

## 2014-11-22 NOTE — Telephone Encounter (Signed)
Letter mailed to the pt. 

## 2014-11-22 NOTE — Telephone Encounter (Signed)
Per RMR-  Send letter to patient.  Send copy of letter with path to referring provider and PCP.    Needs office visit with us in 12 weeks. Will need repeat EGD to assess for ulcer healing.

## 2014-11-22 NOTE — Telephone Encounter (Signed)
APPT MADE AND LETTER SENT  °

## 2014-11-26 ENCOUNTER — Encounter (HOSPITAL_COMMUNITY): Payer: Self-pay | Admitting: Internal Medicine

## 2014-12-12 ENCOUNTER — Ambulatory Visit: Payer: 59 | Admitting: Family Medicine

## 2014-12-19 ENCOUNTER — Encounter: Payer: Self-pay | Admitting: Family Medicine

## 2014-12-19 ENCOUNTER — Ambulatory Visit (INDEPENDENT_AMBULATORY_CARE_PROVIDER_SITE_OTHER): Payer: 59 | Admitting: Family Medicine

## 2014-12-19 VITALS — BP 142/80 | HR 82 | Temp 98.1°F | Resp 18 | Ht 61.5 in | Wt 180.0 lb

## 2014-12-19 DIAGNOSIS — M25512 Pain in left shoulder: Secondary | ICD-10-CM | POA: Diagnosis not present

## 2014-12-19 NOTE — Progress Notes (Signed)
Subjective:    Patient ID: [name], female    DOB: 1950/03/08, 64 y.o.   MRN: [id number]  HPI 05/07/14 This weekend, the patient tripped walking out of her home. She hyperflexed her right ankle. She also suffered an inversion injury to the right ankle. She now has pain and swelling over the right lateral malleolus. She is exquisitely tender to palpation over the right lateral malleolus. She has pain with flexion and extension of the ankle. It hurts to bear weight. There is some mild swelling and ankle. There is no bruising. There is no visible deformity to the ankle. She has no pain in the forefoot. There is no pain or swelling in the toes.  At that time, my plan was: Patient sprained her ankle and I will obtain an x-ray to rule out an avulsion fracture of the distal fibula. I recommended the patient wear a lace up ankle brace for support. I recommended she rest ice and elevate her leg is much as possible. I recommended weightbearing as tolerated. I recommended diclofenac 75 mg by mouth twice a day. Certainly if there is a fracture seen on x-ray this plan may change. If this is a sprain, I anticipate gradual improvement over the next 2-4 weeks.  06/18/14  patient is here today for recheck. She continues to have some mild pain in her right ankle. The pain is usually over the lateral malleolus or the Achilles tendon. The pain is mild. Is not present on a daily basis. The diclofenac helps the pain. X-ray was negative for any fracture. She is walking without difficulty. She again is having pain in her left shoulder. It hurts with abduction greater than 90. It hurts wth internal and nomal rotation. She last received cortisone injectin in December. This heped her substantially. She would like to proceed with another cortisone injection today.  At that time, my plan was:  believe the sprain of her ankle is healing appropriately. I believe she may have some residual scar tissue and inflammation from the  sprain. Therefore I've recommended physical therapy. She does not want to go to formal physical therapy. She would like to perform some exercises that I described her at home to see if she can improve the range of motion in her ankle an increase her flexibility. I would recommend formal physical therapy if no better. Using sterile technique, I injected the left shoulder with 2 mL of lidocaine, 2 mL of  Marcaine, and 2 mL of 40 mg per mL Kenalog.   Patient tolerated procedure well without complication.  12/19/14 Pain has returned to her left shoulder. She has pain with abduction greater than 90. She has pain with internal and external rotation. It hurts to put her shirt on.  It hurts to lift objects over her head. She is requesting a repeat cortisone injection Past Medical History  Diagnosis Date  . Headache(784.0)   . Depression   . GERD (gastroesophageal reflux disease)   . Hypertension   . Dysphagia    Past Surgical History  Procedure Laterality Date  . Tracheostomy    . Esophagogastroduodenoscopy    . Colonoscopy    . Tubal ligation    . Esophagogastroduodenoscopy N/A 11/10/2014    Procedure: ESOPHAGOGASTRODUODENOSCOPY (EGD);  Surgeon: Corbin Ade, MD;  Location: [facility];  Service: Endoscopy;  Laterality: N/A;  . Esophagogastroduodenoscopy N/A 11/20/2014    Procedure: ESOPHAGOGASTRODUODENOSCOPY (EGD);  Surgeon: Corbin Ade, MD;  Location: [facility];  Service: Endoscopy;  Laterality: N/A;  0930  . Esophageal dilation N/A 11/20/2014    Procedure: ESOPHAGEAL DILATION;  Surgeon: Corbin Ade, MD;  Location: [facility];  Service: Endoscopy;  Laterality: N/A;   Current Outpatient Prescriptions on File Prior to Visit  Medication Sig Dispense Refill  . Aspirin-Salicylamide-Caffeine (BC HEADACHE POWDER PO) Take 1 packet by mouth every 4 (four) hours as needed. Pain    . diphenhydrAMINE (BENADRYL) 25 mg capsule Take 25 mg by mouth every 6 (six) hours as needed.    . meclizine  (ANTIVERT) 25 MG tablet Take 1 tablet (25 mg total) by mouth 3 (three) times daily as needed for dizziness. 30 tablet 0  . metoprolol succinate (TOPROL-XL) 50 MG 24 hr tablet Take 1 tablet (50 mg total) by mouth daily. Take with or immediately following a meal. 90 tablet 3  . escitalopram (LEXAPRO) 10 MG tablet Take 1 tablet (10 mg total) by mouth daily. (Patient not taking: Reported on 12/19/2014) 30 tablet 5   No current facility-administered medications on file prior to visit.   No Known Allergies Social History   Social History  . Marital Status: Married    Spouse Name: N/A  . Number of Children: N/A  . Years of Education: N/A   Occupational History  . Not on file.   Social History Main Topics  . Smoking status: Never Smoker   . Smokeless tobacco: Not on file  . Alcohol Use: No  . Drug Use: No  . Sexual Activity: Not on file   Other Topics Concern  . Not on file   Social History Narrative      Review of Systems  All other systems reviewed and are negative.      Objective:   Physical Exam  Constitutional: She appears well-developed and well-nourished.  Cardiovascular: Normal rate and regular rhythm.   Pulmonary/Chest: Effort normal and breath sounds normal.  Musculoskeletal:       Left shoulder: She exhibits decreased range of motion and pain. She exhibits no tenderness, no bony tenderness, no crepitus and normal strength.  Vitals reviewed.         Assessment & Plan:  Left shoulder pain - Plan: DG Shoulder Left   using sterile technique, I injected her left shoulder with a mixture of 2 mL of lidocaine, 2 mL of Marcaine, and 2 mL of 40 mg per mL Kenalog. I also asked the patient to go get an x-ray of her left shoulder to rule out other potential causes for left shoulder pain although I do believe this is chronic rotator cuff tendinitis. If the x-ray is normal, I will recommend physical therapy to hopefully prevent this from coming back. I explained to the  patient that repeat cortisone injections is not a good long-term therapeutic plan for her chronic left shoulder pain.

## 2015-02-07 ENCOUNTER — Ambulatory Visit (INDEPENDENT_AMBULATORY_CARE_PROVIDER_SITE_OTHER): Payer: 59 | Admitting: Family Medicine

## 2015-02-07 ENCOUNTER — Encounter: Payer: Self-pay | Admitting: Family Medicine

## 2015-02-07 VITALS — BP 140/82 | HR 80 | Temp 97.5°F | Resp 14 | Ht 62.0 in | Wt 174.0 lb

## 2015-02-07 DIAGNOSIS — H8110 Benign paroxysmal vertigo, unspecified ear: Secondary | ICD-10-CM | POA: Diagnosis not present

## 2015-02-07 DIAGNOSIS — I10 Essential (primary) hypertension: Secondary | ICD-10-CM

## 2015-02-07 DIAGNOSIS — R42 Dizziness and giddiness: Secondary | ICD-10-CM

## 2015-02-07 DIAGNOSIS — F329 Major depressive disorder, single episode, unspecified: Secondary | ICD-10-CM

## 2015-02-07 DIAGNOSIS — F32A Depression, unspecified: Secondary | ICD-10-CM

## 2015-02-07 LAB — COMPREHENSIVE METABOLIC PANEL
ALT: 10 U/L (ref 6–29)
AST: 12 U/L (ref 10–35)
Albumin: 3.8 g/dL (ref 3.6–5.1)
Alkaline Phosphatase: 71 U/L (ref 33–130)
BUN: 10 mg/dL (ref 7–25)
CHLORIDE: 103 mmol/L (ref 98–110)
CO2: 27 mmol/L (ref 20–31)
CREATININE: 0.85 mg/dL (ref 0.50–0.99)
Calcium: 9.3 mg/dL (ref 8.6–10.4)
GLUCOSE: 80 mg/dL (ref 70–99)
Potassium: 3.8 mmol/L (ref 3.5–5.3)
SODIUM: 139 mmol/L (ref 135–146)
TOTAL PROTEIN: 6.5 g/dL (ref 6.1–8.1)
Total Bilirubin: 0.4 mg/dL (ref 0.2–1.2)

## 2015-02-07 LAB — CBC WITH DIFFERENTIAL/PLATELET
BASOS PCT: 1 % (ref 0–1)
Basophils Absolute: 0.1 10*3/uL (ref 0.0–0.1)
EOS ABS: 0.2 10*3/uL (ref 0.0–0.7)
EOS PCT: 2 % (ref 0–5)
HCT: 41 % (ref 36.0–46.0)
Hemoglobin: 12.3 g/dL (ref 12.0–15.0)
Lymphocytes Relative: 26 % (ref 12–46)
Lymphs Abs: 2 10*3/uL (ref 0.7–4.0)
MCH: 23.4 pg — AB (ref 26.0–34.0)
MCHC: 30 g/dL (ref 30.0–36.0)
MCV: 78.1 fL (ref 78.0–100.0)
MONOS PCT: 11 % (ref 3–12)
MPV: 10.3 fL (ref 8.6–12.4)
Monocytes Absolute: 0.9 10*3/uL (ref 0.1–1.0)
Neutro Abs: 4.7 10*3/uL (ref 1.7–7.7)
Neutrophils Relative %: 60 % (ref 43–77)
PLATELETS: 492 10*3/uL — AB (ref 150–400)
RBC: 5.25 MIL/uL — ABNORMAL HIGH (ref 3.87–5.11)
RDW: 16.7 % — AB (ref 11.5–15.5)
WBC: 7.8 10*3/uL (ref 4.0–10.5)

## 2015-02-07 LAB — TSH: TSH: 0.651 u[IU]/mL (ref 0.350–4.500)

## 2015-02-07 MED ORDER — METOPROLOL SUCCINATE ER 50 MG PO TB24: 50.0000 mg | ORAL_TABLET | Freq: Every day | ORAL | Status: DC

## 2015-02-07 MED ORDER — ESCITALOPRAM OXALATE 10 MG PO TABS: 10.0000 mg | ORAL_TABLET | Freq: Every day | ORAL | Status: DC

## 2015-02-07 MED ORDER — PANTOPRAZOLE SODIUM 40 MG PO TBEC: 40.0000 mg | DELAYED_RELEASE_TABLET | Freq: Every day | ORAL | Status: DC

## 2015-02-07 MED ORDER — MECLIZINE HCL 25 MG PO TABS: 25.0000 mg | ORAL_TABLET | Freq: Three times a day (TID) | ORAL | Status: DC | PRN

## 2015-02-07 NOTE — Patient Instructions (Addendum)
We will call with lab results  Take the meclizine twice a day for dizziness for the next week, then once a day as needed' Restart your lexapro this is for your depression The pantoprazole is for acid reflux- take 30 minutes before breakfast F/U pending results
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
## 2015-02-26 ENCOUNTER — Ambulatory Visit: Payer: BLUE CROSS/BLUE SHIELD | Admitting: Gastroenterology

## 2015-03-05 ENCOUNTER — Other Ambulatory Visit: Payer: Self-pay | Admitting: Family Medicine

## 2015-03-05 NOTE — Telephone Encounter (Signed)
Refill appropriate and filled per protocol. 

## 2015-03-26 ENCOUNTER — Ambulatory Visit: Payer: 59 | Admitting: Family Medicine

## 2015-03-27 ENCOUNTER — Ambulatory Visit (INDEPENDENT_AMBULATORY_CARE_PROVIDER_SITE_OTHER): Payer: 59 | Admitting: Family Medicine

## 2015-03-27 ENCOUNTER — Encounter: Payer: Self-pay | Admitting: Family Medicine

## 2015-03-27 VITALS — BP 132/82 | HR 70 | Temp 97.5°F | Resp 18 | Wt 180.0 lb

## 2015-03-27 DIAGNOSIS — F4321 Adjustment disorder with depressed mood: Secondary | ICD-10-CM

## 2015-03-27 DIAGNOSIS — F329 Major depressive disorder, single episode, unspecified: Secondary | ICD-10-CM | POA: Insufficient documentation

## 2015-03-27 DIAGNOSIS — L659 Nonscarring hair loss, unspecified: Secondary | ICD-10-CM

## 2015-03-27 DIAGNOSIS — F4320 Adjustment disorder, unspecified: Secondary | ICD-10-CM | POA: Insufficient documentation

## 2015-03-27 DIAGNOSIS — F3342 Major depressive disorder, recurrent, in full remission: Secondary | ICD-10-CM | POA: Diagnosis not present

## 2015-03-27 MED ORDER — KETOCONAZOLE 2 % EX SHAM: 1.0000 "application " | MEDICATED_SHAMPOO | CUTANEOUS | Status: DC

## 2015-03-27 MED ORDER — LORAZEPAM 0.5 MG PO TABS
0.5000 mg | ORAL_TABLET | Freq: Two times a day (BID) | ORAL | Status: DC | PRN
Start: 2015-03-27 — End: 2015-10-13
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
## 2015-03-27 NOTE — Patient Instructions (Addendum)
Continue lexapro Take the ativan for your nerves and before bedtime Referral to psychiatry  Use the prescription shampoo- Nizoral  F/U 4 weeks

## 2015-03-27 NOTE — Assessment & Plan Note (Signed)
She has major depression with very poor social support. Her husband and she pretty much lived separate lives and reside in the same home. She does not get any support from her son. Her last visit I started her on Lexapro but now her good ran passed away which she has been a significant grief since then. I think dishes open up previous old winter she is a multiple family members passed in her presence including a nephew that actually hung himself. I think she needs depression and grief therapy as he is a long-standing issue for her. I will continue the Lexapro and add Ativan 0.5 mg at bedtime to help with sleep

## 2015-04-02 ENCOUNTER — Telehealth (HOSPITAL_COMMUNITY): Payer: Self-pay | Admitting: *Deleted

## 2015-04-25 ENCOUNTER — Ambulatory Visit: Payer: 59 | Admitting: Family Medicine

## 2015-05-06 ENCOUNTER — Encounter: Payer: Self-pay | Admitting: Family Medicine

## 2015-05-06 ENCOUNTER — Emergency Department (HOSPITAL_COMMUNITY): Payer: 59

## 2015-05-06 ENCOUNTER — Emergency Department (HOSPITAL_COMMUNITY)
Admission: EM | Admit: 2015-05-06 | Discharge: 2015-05-06 | Disposition: A | Discharge status: Home / Self Care | Payer: 59 | Attending: Emergency Medicine | Admitting: Emergency Medicine

## 2015-05-06 ENCOUNTER — Ambulatory Visit (INDEPENDENT_AMBULATORY_CARE_PROVIDER_SITE_OTHER): Payer: 59 | Admitting: Family Medicine

## 2015-05-06 ENCOUNTER — Encounter (HOSPITAL_COMMUNITY): Payer: Self-pay | Admitting: Emergency Medicine

## 2015-05-06 VITALS — BP 132/80 | HR 76 | Temp 98.0°F | Resp 18 | Ht 61.5 in | Wt 181.0 lb

## 2015-05-06 DIAGNOSIS — F329 Major depressive disorder, single episode, unspecified: Secondary | ICD-10-CM | POA: Insufficient documentation

## 2015-05-06 DIAGNOSIS — M4802 Spinal stenosis, cervical region: Secondary | ICD-10-CM | POA: Insufficient documentation

## 2015-05-06 DIAGNOSIS — I10 Essential (primary) hypertension: Secondary | ICD-10-CM | POA: Insufficient documentation

## 2015-05-06 DIAGNOSIS — M79602 Pain in left arm: Secondary | ICD-10-CM | POA: Diagnosis present

## 2015-05-06 DIAGNOSIS — Z7982 Long term (current) use of aspirin: Secondary | ICD-10-CM | POA: Insufficient documentation

## 2015-05-06 DIAGNOSIS — M5020 Other cervical disc displacement, unspecified cervical region: Secondary | ICD-10-CM

## 2015-05-06 DIAGNOSIS — M5010 Cervical disc disorder with radiculopathy, unspecified cervical region: Secondary | ICD-10-CM | POA: Diagnosis not present

## 2015-05-06 DIAGNOSIS — M5412 Radiculopathy, cervical region: Secondary | ICD-10-CM

## 2015-05-06 DIAGNOSIS — Z79899 Other long term (current) drug therapy: Secondary | ICD-10-CM | POA: Insufficient documentation

## 2015-05-06 NOTE — ED Provider Notes (Signed)
CSN: [id number]     Arrival date & time 05/06/15  1153 History   First MD Initiated Contact with Patient 05/06/15 1224     Chief Complaint  Patient presents with  . Arm Pain     (Consider location/radiation/quality/duration/timing/severity/associated sxs/prior Treatment) The history is provided by the patient.   [name] is a 65 y.o. female presenting for further evaluation of left shoulder and arm pain since falling 2 weeks ago.  She describes tripping over a pillow and hit her head against a door, and landing with her left arm tucked underneath her body.  Since this event she has had persistent pain in the left shoulder and also describes intermittent burning tingling pain that radiates into her fingertips.  She denies neck pain or injury, and has had no persistent headaches, focal weakness, dizziness or other symptoms since the fall.  She was seen by her PCP this morning who placed her on a prednisone taper and was sent here for imaging studies over concern for cervical radiculopathy and C-spine injury.  She has found no alleviators for her pain except for rest.    Past Medical History  Diagnosis Date  . Headache(784.0)   . Depression   . GERD (gastroesophageal reflux disease)   . Hypertension   . Dysphagia    Past Surgical History  Procedure Laterality Date  . Tracheostomy    . Esophagogastroduodenoscopy    . Colonoscopy    . Tubal ligation    . Esophagogastroduodenoscopy N/A 11/10/2014    HQI:[id number]:prominent schatzkis ringerosive reflux esophagitis  . Esophagogastroduodenoscopy N/A 11/20/2014    XLK:[id number]:schatzki's ring s/p dilation/HH  . Esophageal dilation N/A 11/20/2014    Procedure: ESOPHAGEAL DILATION;  Surgeon: Corbin Adeobert M Rourk, MD;  Location: [facility];  Service: Endoscopy;  Laterality: N/A;   History reviewed. No pertinent family history. Social History  Substance Use Topics  . Smoking status: Never Smoker   . Smokeless tobacco: None  . Alcohol Use: No   OB  History    No data available     Review of Systems  Constitutional: Negative for fever.  Musculoskeletal: Positive for arthralgias. Negative for myalgias, joint swelling, neck pain and neck stiffness.  Neurological: Positive for numbness. Negative for weakness.      Allergies  Review of patient's allergies indicates no known allergies.  Home Medications   Prior to Admission medications   Medication Sig Start Date End Date Taking? Authorizing Provider  Aspirin-Salicylamide-Caffeine (BC HEADACHE POWDER PO) Take 1 packet by mouth every 4 (four) hours as needed. Pain   Yes Historical Provider, MD  LORazepam (ATIVAN) 0.5 MG tablet Take 1 tablet (0.5 mg total) by mouth 2 (two) times daily as needed for anxiety. 03/27/15  Yes Salley ScarletKawanta F Carlisle, MD  metoprolol succinate (TOPROL-XL) 50 MG 24 hr tablet Take 1 tablet (50 mg total) by mouth daily. Take with or immediately following a meal. 02/07/15  Yes Salley ScarletKawanta F [place], MD  pantoprazole (PROTONIX) 40 MG tablet Take 1 tablet (40 mg total) by mouth daily. 02/07/15  Yes Salley ScarletKawanta F [place], MD  escitalopram (LEXAPRO) 10 MG tablet Take 1 tablet (10 mg total) by mouth daily. Patient not taking: Reported on 05/06/2015 02/07/15   Salley ScarletKawanta F Hooven, MD  ketoconazole (NIZORAL) 2 % shampoo Apply 1 application topically 2 (two) times a week. Patient not taking: Reported on 05/06/2015 03/27/15   Salley ScarletKawanta F Geary, MD  meclizine (ANTIVERT) 25 MG tablet TAKE ONE TABLET BY MOUTH THREE TIMES DAILY AS NEEDED FOR  DIZZINESS Patient not taking: Reported on 05/06/2015 03/05/15   Donita Brooks, MD   BP 126/60 mmHg  Pulse 77  Temp(Src) 98.7 F (37.1 C) (Oral)  Resp 16  Ht  (1.549 m)  Wt 82.101 kg  BMI 34.22 kg/m2  SpO2 100% Physical Exam  Constitutional: She appears well-developed and well-nourished.  HENT:  Head: Atraumatic.  Neck: Normal range of motion.  Cardiovascular:  Pulses equal bilaterally  Musculoskeletal: She exhibits tenderness.       Left  shoulder: She exhibits bony tenderness. She exhibits no swelling, no spasm, normal pulse and normal strength.       Cervical back: Normal. She exhibits no bony tenderness, no swelling, no deformity and no spasm.  ttp anterior left proximal humerus.  No palpable deformity. No dislocation.    Neurological: She is alert. She has normal strength. She displays normal reflexes. No sensory deficit.  Resisted strength flex/ext left wrist and elbow full, but with increased pain proximal bicep area.  No palpable deformity or mass.  Skin: Skin is warm and dry.  Psychiatric: She has a normal mood and affect.    ED Course  Procedures (including critical care time) Labs Review Labs Reviewed - No data to display  Imaging Review Mr Cervical Spine Wo Contrast  05/06/2015  CLINICAL DATA:  Neck pain radiating into the left arm following recent fall at home. No previous relevant surgery. EXAM: MRI CERVICAL SPINE WITHOUT CONTRAST TECHNIQUE: Multiplanar, multisequence MR imaging of the cervical spine was performed. No intravenous contrast was administered. COMPARISON:  Limited correlation made with MRI brain 04/17/2008. FINDINGS: Alignment: Mild straightening without focal angulation or listhesis. Vertebrae: No acute or suspicious osseous findings. Cord: Normal in signal and caliber. Posterior Fossa, vertebral arteries, paraspinal tissues: Visualized portions of the posterior fossa and paraspinal soft tissues appear unremarkable. Apparent symmetric bilateral tongue atrophy. Bilateral vertebral artery flow voids. Disc levels: No significant disc space findings at or above C3-4. C4-5: Mild disc bulging, facet hypertrophy and uncinate spurring. No spinal stenosis or nerve root encroachment. C5-6: Loss of disc height with annular disc bulging and uncinate spurring, worse on the right. The CSF surrounding the cord is partially effaced without resulting cord deformity. There is moderate right and mild left foraminal narrowing.  C6-7: Spondylosis with loss of disc height, annular disc bulging eccentric to the left and mild uncinate spurring. The CSF surrounding the cord is partially effaced without cord deformity. Mild left-greater-than-right foraminal narrowing. No definite nerve root encroachment. C7-T1:  Normal interspace. IMPRESSION: 1. No acute findings or clear explanation for left radicular symptoms. 2. 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 This foraminal narrowing appears greatest on the right at C5-6. No definite left-sided nerve root encroachment. Electronically Signed   By: Carey Bullocks M.D.   On: 05/06/2015 13:54   Dg Shoulder Left  05/06/2015  CLINICAL DATA:  Larey Seat against a door, hitting the left shoulder on 04/22/2015. Pain radiates from the shoulder to the fingers. Initial encounter. EXAM: LEFT SHOULDER - 2+ VIEW COMPARISON:  None. FINDINGS: No acute fracture or dislocation is identified. There is minimal AC joint degenerative change. No soft tissue abnormality is seen. IMPRESSION: Minimal AC joint degenerative change without acute abnormality identified. Electronically Signed   By: Sebastian Ache M.D.   On: 05/06/2015 13:29   I have personally reviewed and evaluated these images and lab results as part of my medical decision-making.  EKG Interpretation None      MDM   Final diagnoses:  Herniated cervical disc  Spinal stenosis of cervical region    Discussed image findings with patient and husband.  Advised that she would benefit from a neurosurgical consult and she was given referral to Dr. Venetia Maxon for further evaluation.  However patient was also advised to call her primary doctor to advise of findings as well.  I attempted to contact PCP but had no answer upon calling.  Offered patient a sling to rest the left shoulder, she defers stating she has been at home.  Advised to start her prednisone per  Dr. Caren Macadam prescription.  Patient was seen by Dr. Chalmers Cater during this ED visit.    Burgess Amor, PA-C 05/06/15 1700  Blane Ohara, MD 05/07/15 [phone]

## 2015-05-06 NOTE — Discharge Instructions (Signed)
Spinal Stenosis Spinal stenosis is an abnormal narrowing of the canals of your spine (vertebrae). CAUSES  Spinal stenosis is caused by areas of bone pushing into the central canals of your vertebrae. This condition can be present at birth (congenital). It also may be caused by arthritic deterioration of your vertebrae (spinal degeneration).  SYMPTOMS   Pain that is generally worse with activities, particularly standing and walking.  Numbness, tingling, hot or cold sensations, weakness, or weariness in your legs.  Frequent episodes of falling.  A foot-slapping gait that leads to muscle weakness. DIAGNOSIS  Spinal stenosis is diagnosed with the use of magnetic resonance imaging (MRI) or computed tomography (CT). TREATMENT  Initial therapy for spinal stenosis focuses on the management of the pain and other symptoms associated with the condition. These therapies include:  Practicing postural changes to lessen pressure on your nerves.  Exercises to strengthen the core of your body.  Loss of excess body weight.  The use of nonsteroidal anti-inflammatory medicines to reduce swelling and inflammation in your nerves. When therapies to manage pain are not successful, surgery to treat spinal stenosis may be recommended. This surgery involves removing excess bone, which puts pressure on your nerve roots. During this surgery (laminectomy), the posterior boney arch (lamina) and excess bone around the facet joints are removed.   This information is not intended to replace advice given to you by your health care provider. Make sure you discuss any questions you have with your health care provider.   Document Released: 03/20/2003 Document Revised: 01/18/2014 Document Reviewed: 04/07/2012 Elsevier Interactive Patient Education 2016 [employer]  

## 2015-05-06 NOTE — ED Notes (Signed)
Pt made aware to return if symptoms worsen or if any life threatening symptoms occur.   

## 2015-05-06 NOTE — Progress Notes (Signed)
Subjective:    Patient ID: [name], female    DOB: Oct 17, 1950, 65 y.o.   MRN: [id number]  HPI A few weeks ago, the patient tripped on a pillow, fell, and struck the right side of her head on a door. Ever since that time she has had pain radiating from the left side of her neck into her left shoulder down the lateral aspect of her left arm all the way into her left fingers. She reports the pain is a burning stinging pain consistent with cervical radiculopathy. She denies any weakness in the arm. She has a negative empty can sign. Negative Hawkins sign. There is no crepitus in the shoulder. There is no crepitus of the elbow. There is no erythema or deformity or swelling or bruising in the arm Past Medical History  Diagnosis Date  . Headache(784.0)   . Depression   . GERD (gastroesophageal reflux disease)   . Hypertension   . Dysphagia    Past Surgical History  Procedure Laterality Date  . Tracheostomy    . Esophagogastroduodenoscopy    . Colonoscopy    . Tubal ligation    . Esophagogastroduodenoscopy N/A 11/10/2014    WUJ:WJXBJYNWG schatzkis ringerosive reflux esophagitis  . Esophagogastroduodenoscopy N/A 11/20/2014    NFA:OZHYQMVH'Q ring s/p dilation/HH  . Esophageal dilation N/A 11/20/2014    Procedure: ESOPHAGEAL DILATION;  Surgeon: Corbin Ade, MD;  Location: [facility];  Service: Endoscopy;  Laterality: N/A;   Current Outpatient Prescriptions on File Prior to Visit  Medication Sig Dispense Refill  . Aspirin-Salicylamide-Caffeine (BC HEADACHE POWDER PO) Take 1 packet by mouth every 4 (four) hours as needed. Pain    . diphenhydrAMINE (BENADRYL) 25 mg capsule Take 25 mg by mouth every 6 (six) hours as needed.    Marland Kitchen escitalopram (LEXAPRO) 10 MG tablet Take 1 tablet (10 mg total) by mouth daily. 30 tablet 5  . ketoconazole (NIZORAL) 2 % shampoo Apply 1 application topically 2 (two) times a week. 120 mL 1  . LORazepam (ATIVAN) 0.5 MG tablet Take 1 tablet (0.5 mg total) by  mouth 2 (two) times daily as needed for anxiety. 45 tablet 1  . metoprolol succinate (TOPROL-XL) 50 MG 24 hr tablet Take 1 tablet (50 mg total) by mouth daily. Take with or immediately following a meal. 90 tablet 3  . pantoprazole (PROTONIX) 40 MG tablet Take 1 tablet (40 mg total) by mouth daily. 30 tablet 3  . meclizine (ANTIVERT) 25 MG tablet TAKE ONE TABLET BY MOUTH THREE TIMES DAILY AS NEEDED FOR DIZZINESS (Patient not taking: Reported on 05/06/2015) 30 tablet 0   No current facility-administered medications on file prior to visit.   No Known Allergies    Review of Systems  All other systems reviewed and are negative.      Objective:   Physical Exam  Cardiovascular: Normal rate, regular rhythm and normal heart sounds.   Pulmonary/Chest: Effort normal and breath sounds normal.  Musculoskeletal:       Left shoulder: She exhibits decreased range of motion, pain and decreased strength.       Left elbow: Normal.       Left wrist: Normal.       Cervical back: She exhibits tenderness. She exhibits normal range of motion.  Vitals reviewed.         Assessment & Plan:  Cervical radiculopathy  Exam and history are inconclusive however I'm concerned about a possible herniated disc in the neck from the trauma causing  cervical radiculopathy. I recommend a prednisone taper pack and I'll obtain x-rays of the neck to evaluate further to rule out any kind of skeletal injury.

## 2015-05-06 NOTE — ED Notes (Signed)
Pt states that she experienced a fall on the 11th and has been experiencing left arm pain since that fall.  Pt states that she tripped over a pillow, and hit her head on the right side and her arm.  Denies LOC and pain on her head.  Pt states that her primary care physician requested that she have an xray on her arm.

## 2015-05-07 ENCOUNTER — Telehealth: Payer: Self-pay | Admitting: Family Medicine

## 2015-05-07 MED ORDER — PREDNISONE 20 MG PO TABS: ORAL_TABLET | ORAL | Status: DC

## 2015-05-07 NOTE — Telephone Encounter (Signed)
Pt called and states that WTP was going to call her in some pain medication. Per note he was going to call in a pred taper pack but no mention of pain meds. Pt ultimately went to the ER following her ov here and had an MRI done at that time. She states that she has a pinched nerve in her neck and wanted to know why pain med was not called in? Pred taper pack sent to pharm as per LOV and will send WTP note about pain medication and next step for patient.

## 2015-05-08 NOTE — Telephone Encounter (Signed)
LMTRC

## 2015-05-08 NOTE — Telephone Encounter (Signed)
Take the prednisone taper pack as I recommended.  I am fine with her having tramadol 50 mg 1-2 po q 8 hrs prn pain (30).

## 2015-05-13 NOTE — Telephone Encounter (Signed)
lmtrc

## 2015-05-16 ENCOUNTER — Telehealth: Payer: Self-pay | Admitting: Family Medicine

## 2015-05-16 DIAGNOSIS — I10 Essential (primary) hypertension: Secondary | ICD-10-CM

## 2015-05-16 MED ORDER — METOPROLOL SUCCINATE ER 50 MG PO TB24: 50.0000 mg | ORAL_TABLET | Freq: Every day | ORAL | Status: DC

## 2015-05-16 NOTE — Telephone Encounter (Signed)
Medication called/sent to requested pharmacy with note to refill early

## 2015-05-16 NOTE — Telephone Encounter (Signed)
Patient is calling to say she lost her blood pressure medication, she has not taken it in two day, can she please get her metoprolol called into [facility] in [place]   [phone]

## 2015-05-22 ENCOUNTER — Ambulatory Visit (HOSPITAL_COMMUNITY): Payer: Self-pay | Admitting: Psychiatry

## 2015-07-21 ENCOUNTER — Ambulatory Visit: Payer: 59 | Admitting: Family Medicine

## 2015-07-22 ENCOUNTER — Ambulatory Visit: Payer: 59 | Admitting: Family Medicine

## 2015-09-30 ENCOUNTER — Ambulatory Visit: Payer: 59 | Admitting: Family Medicine

## 2015-10-03 ENCOUNTER — Ambulatory Visit: Payer: 59 | Admitting: Family Medicine

## 2015-10-13 ENCOUNTER — Encounter: Payer: Self-pay | Admitting: Family Medicine

## 2015-10-13 ENCOUNTER — Ambulatory Visit (INDEPENDENT_AMBULATORY_CARE_PROVIDER_SITE_OTHER): Payer: Commercial Managed Care - HMO | Admitting: Family Medicine

## 2015-10-13 VITALS — BP 140/90 | HR 82 | Temp 98.6°F | Resp 18 | Ht 61.5 in | Wt 181.0 lb

## 2015-10-13 DIAGNOSIS — F320 Major depressive disorder, single episode, mild: Secondary | ICD-10-CM | POA: Diagnosis not present

## 2015-10-13 DIAGNOSIS — R739 Hyperglycemia, unspecified: Secondary | ICD-10-CM

## 2015-10-13 DIAGNOSIS — G8929 Other chronic pain: Secondary | ICD-10-CM | POA: Diagnosis not present

## 2015-10-13 DIAGNOSIS — M25512 Pain in left shoulder: Secondary | ICD-10-CM | POA: Diagnosis not present

## 2015-10-13 LAB — BASIC METABOLIC PANEL WITH GFR
BUN: 9 mg/dL (ref 7–25)
CALCIUM: 9.2 mg/dL (ref 8.6–10.4)
CO2: 24 mmol/L (ref 20–31)
Chloride: 106 mmol/L (ref 98–110)
Creat: 0.69 mg/dL (ref 0.50–0.99)
Glucose, Bld: 87 mg/dL (ref 70–99)
Potassium: 4.3 mmol/L (ref 3.5–5.3)
SODIUM: 140 mmol/L (ref 135–146)

## 2015-10-13 MED ORDER — ESCITALOPRAM OXALATE 10 MG PO TABS: 10.0000 mg | ORAL_TABLET | Freq: Every day | ORAL | 5 refills | Status: DC

## 2015-10-13 NOTE — Progress Notes (Signed)
Subjective:    Patient ID: [name], female    DOB: 01/15/1950, 65 y.o.   MRN: [id number]  HPI Patient stopped taking her Lexapro which worked well for depression. Over the last few weeks, the symptoms of depression have returned and she would like to resume that medication. She is also concerned that she may be a diabetic. Her sister has diabetes. She is also been experiencing polyuria and polydipsia recently. Her symptoms sound more like overactive bladder syndrome however the patient is concerned that she may be a diabetic. She states that she is checking her sugars at random times and they have been slightly elevated. She is also complaining of pain in her left shoulder. In the past I have performed a cortisone injection and the pain has improved. She is asking me to repeat cortisone injection. She has pain with abduction greater than 100. She has a positive Hawkins sign. She has an equivocal empty can sign. She denies any neck pain. Past Medical History:  Diagnosis Date  . Depression   . Dysphagia   . GERD (gastroesophageal reflux disease)   . Headache(784.0)   . Hypertension    Past Surgical History:  Procedure Laterality Date  . COLONOSCOPY    . ESOPHAGEAL DILATION N/A 11/20/2014   Procedure: ESOPHAGEAL DILATION;  Surgeon: Corbin Ade, MD;  Location[facility];  Service: Endoscopy;  Laterality: N/A;  . ESOPHAGOGASTRODUODENOSCOPY    . ESOPHAGOGASTRODUODENOSCOPY N/A 11/10/2014   WUJ:WJXBJYNWG schatzkis ringerosive reflux esophagitis  . ESOPHAGOGASTRODUODENOSCOPY N/A 11/20/2014   NFA:OZHYQMVH'Q ring s/p dilation/HH  . TRACHEOSTOMY    . TUBAL LIGATION     Current Outpatient Prescriptions on File Prior to Visit  Medication Sig Dispense Refill  . metoprolol succinate (TOPROL-XL) 50 MG 24 hr tablet Take 1 tablet (50 mg total) by mouth daily. Take with or immediately following a meal. 90 tablet 3  . Aspirin-Salicylamide-Caffeine (BC HEADACHE POWDER PO) Take 1 packet by  mouth every 4 (four) hours as needed. Pain     No current facility-administered medications on file prior to visit.    No Known Allergies Social History   Social History  . Marital status: Married    Spouse name: N/A  . Number of children: N/A  . Years of education: N/A   Occupational History  . Not on file.   Social History Main Topics  . Smoking status: Never Smoker  . Smokeless tobacco: Not on file  . Alcohol use No  . Drug use: No  . Sexual activity: Not on file   Other Topics Concern  . Not on file   Social History Narrative  . No narrative on file      Review of Systems  All other systems reviewed and are negative.      Objective:   Physical Exam  Cardiovascular: Normal rate, regular rhythm and normal heart sounds.   Pulmonary/Chest: Effort normal and breath sounds normal.  Musculoskeletal:       Left shoulder: She exhibits decreased range of motion and tenderness. She exhibits normal strength.  Vitals reviewed.         Assessment & Plan:  Chronic left shoulder pain  Hyperglycemia - Plan: Hemoglobin A1c, BASIC METABOLIC PANEL WITH GFR  Mild single current episode of major depressive disorder (HCC) - Plan: escitalopram (LEXAPRO) 10 MG tablet  Resume Lexapro 10 mg a day. Check A1c to rule out diabetes. I suspect that she has overactive bladder. If lab work is normal, we could try  oxybutynin. I believe she has subacromial bursitis. Using sterile technique, I injected the left shoulder with 2 mL of lidocaine, 2 mL of Marcaine, and 2 mL of 40 mg per mL Kenalog. Patient tolerated the procedure well without complication

## 2015-10-14 LAB — HEMOGLOBIN A1C
HEMOGLOBIN A1C: 5.5 % (ref <5.7)
MEAN PLASMA GLUCOSE: 111 mg/dL

## 2015-10-15 ENCOUNTER — Other Ambulatory Visit: Payer: Self-pay | Admitting: Family Medicine

## 2015-10-15 MED ORDER — OXYBUTYNIN CHLORIDE 5 MG PO TABS: 5.0000 mg | ORAL_TABLET | Freq: Two times a day (BID) | ORAL | 5 refills | Status: DC

## 2015-11-17 ENCOUNTER — Telehealth: Payer: Self-pay | Admitting: Family Medicine

## 2015-11-17 NOTE — Telephone Encounter (Signed)
Pt called and LMOVM @ 9:15 am stating that she did not feel well and her BP was not good and she was wondering if she was having a heart attack to please call her back asap. Checked VM @ 4:10 pm  and immediatly called her back - got vm - left message to return call.

## 2015-11-24 NOTE — Telephone Encounter (Signed)
No return call - closing note 

## 2015-12-11 ENCOUNTER — Telehealth: Payer: Self-pay | Admitting: Family Medicine

## 2015-12-11 DIAGNOSIS — F320 Major depressive disorder, single episode, mild: Secondary | ICD-10-CM

## 2015-12-11 MED ORDER — ESCITALOPRAM OXALATE 10 MG PO TABS: 10.0000 mg | ORAL_TABLET | Freq: Every day | ORAL | 5 refills | Status: DC

## 2015-12-11 NOTE — Telephone Encounter (Signed)
Pt called LMOVM needing a refill on her depression medication that she lost it will send not to pharm to see if she can get it filled early. Sent new RX to pharm and pt aware.

## 2015-12-16 NOTE — Congregational Nurse Program (Signed)
Congregational Nurse Program Note  Date of Encounter: 12/09/2015  Past Medical History: Past Medical History:  Diagnosis Date  . Depression   . Dysphagia   . GERD (gastroesophageal reflux disease)   . Headache(784.0)   . Hypertension     Encounter Details:     CNP Questionnaire - 12/09/15 1000      Patient Demographics   Is this a new or existing patient? New   Patient is considered a/an Not Applicable   Race Caucasian/White     Patient Assistance   Location of Patient Assistance [facility], [place]   Patient's financial/insurance status Low Income;Medicare   Uninsured Patient ([place] [employer]s) Yes   Interventions Counseled to make appt. with provider   Patient referred to apply for the following financial assistance Not Applicable   Food insecurities addressed Not Applicable   Transportation assistance No   Assistance securing medications No   Educational health offerings Behavioral health;Navigating the healthcare system     Encounter Details   Primary purpose of visit Chronic Illness/Condition Visit;Education/Health Concerns;Navigating the Healthcare System   Was an Emergency Department visit averted? Not Applicable   Does patient have a medical provider? Yes   Patient referred to Area Agency;Follow up with established PCP   Was a mental health screening completed? (GAINS tool) No   Does patient have dental issues? No   Does patient have vision issues? No   Does your patient have an abnormal blood pressure today? Yes   Since previous encounter, have you referred patient for abnormal blood pressure that resulted in a new diagnosis or medication change? No   Does your patient have an abnormal blood glucose today? No   Since previous encounter, have you referred patient for abnormal blood glucose that resulted in a new diagnosis or medication change? No   Was there a life-saving intervention made? No     New client encountered during [facility]  screen[facility]. Client states she has been under stress. She sees Dr Tanya NonesPickard and encouraged client to follow up with her private MD. Will recheck her blood pressure when she is back for food at the food pantry.

## 2016-02-18 ENCOUNTER — Ambulatory Visit: Payer: Commercial Managed Care - HMO | Admitting: Physician Assistant

## 2016-04-14 ENCOUNTER — Encounter: Payer: Self-pay | Admitting: Physician Assistant

## 2016-04-14 ENCOUNTER — Ambulatory Visit (INDEPENDENT_AMBULATORY_CARE_PROVIDER_SITE_OTHER): Payer: Medicare HMO | Admitting: Physician Assistant

## 2016-04-14 VITALS — BP 110/70 | HR 60 | Temp 98.5°F | Resp 16 | Wt 184.2 lb

## 2016-04-14 DIAGNOSIS — F321 Major depressive disorder, single episode, moderate: Secondary | ICD-10-CM | POA: Diagnosis not present

## 2016-04-14 DIAGNOSIS — M5090 Cervical disc disorder, unspecified, unspecified cervical region: Secondary | ICD-10-CM

## 2016-04-14 DIAGNOSIS — M792 Neuralgia and neuritis, unspecified: Secondary | ICD-10-CM | POA: Diagnosis not present

## 2016-04-14 MED ORDER — ESCITALOPRAM OXALATE 20 MG PO TABS: 20.0000 mg | ORAL_TABLET | Freq: Every day | ORAL | 2 refills | Status: DC

## 2016-04-14 NOTE — Progress Notes (Signed)
Patient ID: [name] MRN: [id number], DOB: August 05, 1950, 66 y.o. Date of Encounter: @DATE @  Chief Complaint:  Chief Complaint  Patient presents with  . pain in left arm  . Fatigue    HPI: 66 y.o. year old female  presents with above.   Told her that when I reviewed her chart I see that she has had multiple visits with either chronic left shoulder pain and 1 with diagnosis spinal stenosis cervical region. She then says that that this symptom she has been having recently are new and different. She reports that she has been feeling pain going down the left arm to her fingers recently. He has a burning sensation in certain areas and points to the left tricep region is one area that is a burning pain. Has Noticed no weakness with the arm or grip strength. Has not been dropping things.  When I ask about the fatigue she says that she has noticed feeling increased fatigue for about 2 months. Says "I don't want to get up and do nothing but I know I have to ". Says that she "has been depressed ever since her daddy died in 16". Then starts listing off multiple deaths of recent family members. Says she does feel depressed.  Past Medical History:  Diagnosis Date  . Depression   . Dysphagia   . GERD (gastroesophageal reflux disease)   . Headache(784.0)   . Hypertension      Home Meds: Outpatient Medications Prior to Visit  Medication Sig Dispense Refill  . Aspirin-Salicylamide-Caffeine (BC HEADACHE POWDER PO) Take 1 packet by mouth every 4 (four) hours as needed. Pain    . metoprolol succinate (TOPROL-XL) 50 MG 24 hr tablet Take 1 tablet (50 mg total) by mouth daily. Take with or immediately following a meal. 90 tablet 3  . oxybutynin (DITROPAN) 5 MG tablet Take 1 tablet (5 mg total) by mouth 2 (two) times daily. 60 tablet 5  . escitalopram (LEXAPRO) 10 MG tablet Take 1 tablet (10 mg total) by mouth daily. 30 tablet 5   No facility-administered medications prior to visit.      Allergies: No Known Allergies  Social History   Social History  . Marital status: Married    Spouse name: N/A  . Number of children: N/A  . Years of education: N/A   Occupational History  . Not on file.   Social History Main Topics  . Smoking status: Never Smoker  . Smokeless tobacco: Never Used  . Alcohol use No  . Drug use: No  . Sexual activity: Not on file   Other Topics Concern  . Not on file   Social History Narrative  . No narrative on file    No family history on file.   Review of Systems:  See HPI for pertinent ROS. All other ROS negative.    Physical Exam: Blood pressure 110/70, pulse 60, temperature 98.5 F (36.9 C), temperature source Oral, resp. rate 16, weight 184 lb 3.2 oz (83.6 kg), SpO2 98 %., Body mass index is 34.24 kg/m. General: WNWD WF. Appears in no acute distress. Neck: Supple. No thyromegaly. No lymphadenopathy. Lungs: Clear bilaterally to auscultation without wheezes, rales, or rhonchi. Breathing is unlabored. Heart: RRR with S1 S2. No murmurs, rubs, or gallops. Musculoskeletal:  Strength and tone normal for age. 5/5 grip strength, upper extremity strength bilaterally.  Extremities/Skin: Warm and dry. Neuro: Alert and oriented X 3. Moves all extremities spontaneously. Gait is normal. CNII-XII grossly in tact.  Psych:  Responds to questions appropriately with a normal affect.     ASSESSMENT AND PLAN:  66 y.o. year old female with  1. Moderate single current episode of major depressive disorder (HCC) She is currently on Lexapro 10 mg daily. Will increase the dose to 20 mg daily. - escitalopram (LEXAPRO) 20 MG tablet; Take 1 tablet (20 mg total) by mouth daily.  Dispense: 30 tablet; Refill: 2  2. Cervical disc disease I reviewed the report from MRI cervical spine performed 05/06/2015. This did show significant abnormalities. Given current symptoms will schedule follow-up with a neurosurgeon to get their input regarding further  management. - Ambulatory referral to Neurosurgery  3. Radicular pain in left arm I reviewed the report from MRI cervical spine performed 05/06/2015. This did show significant abnormalities. Given current symptoms will schedule follow-up with a neurosurgeon to get their input regarding further management.  - Ambulatory referral to Neurosurgery   Signed, Shon Hale [place], [place], [facility] 04/14/2016 1:14 PM

## 2016-06-10 ENCOUNTER — Other Ambulatory Visit: Payer: Self-pay | Admitting: Family Medicine

## 2016-06-10 DIAGNOSIS — I10 Essential (primary) hypertension: Secondary | ICD-10-CM

## 2016-10-26 ENCOUNTER — Ambulatory Visit: Payer: Medicare HMO | Admitting: Family Medicine

## 2016-11-05 ENCOUNTER — Encounter: Payer: Self-pay | Admitting: Family Medicine

## 2016-11-05 ENCOUNTER — Ambulatory Visit (INDEPENDENT_AMBULATORY_CARE_PROVIDER_SITE_OTHER): Payer: Medicare PPO | Admitting: Family Medicine

## 2016-11-05 VITALS — BP 136/90 | HR 70 | Temp 98.2°F | Resp 16 | Ht 61.5 in | Wt 178.0 lb

## 2016-11-05 DIAGNOSIS — N3281 Overactive bladder: Secondary | ICD-10-CM

## 2016-11-05 DIAGNOSIS — S29012A Strain of muscle and tendon of back wall of thorax, initial encounter: Secondary | ICD-10-CM

## 2016-11-05 DIAGNOSIS — S0093XA Contusion of unspecified part of head, initial encounter: Secondary | ICD-10-CM

## 2016-11-05 DIAGNOSIS — Z23 Encounter for immunization: Secondary | ICD-10-CM | POA: Diagnosis not present

## 2016-11-05 MED ORDER — OXYBUTYNIN CHLORIDE 5 MG PO TABS: 5.0000 mg | ORAL_TABLET | Freq: Three times a day (TID) | ORAL | 1 refills | Status: DC

## 2016-11-05 NOTE — Addendum Note (Signed)
Addended by: Legrand RamsWILLIS, SANDY B on: 11/05/2016 05:11 PM   Modules accepted: Orders

## 2016-11-05 NOTE — Progress Notes (Signed)
Subjective:    Patient ID: [name], female    DOB: 1950/05/28, 66 y.o.   MRN: [id number]  HPI   Patient has 3 problems.  #1, 1 week ago, she slammed her head against the steel lid of a trash can.  It struck her in the crown of the head.  There was no loss of consciousness although she did briefly see stars.  Ever since that time, she has had a dull headache.  She denies any vision changes.  She denies any nausea or vomiting.  She denies any neurologic deficit.  She denies any seizure activity.  She denies any dizziness or balance issues.  On exam today, cranial nerves II through XII are grossly intact with muscle strength 5/5 equal and symmetric in the upper and lower extremities.  There is no palpable deformity or even swelling in the area of contact.  There is no bruising.  There is no evidence of subcutaneous edema, hematoma, or fracture.  Problem #2, the patient has pain in her mid back under her shoulder blade to the left side of the middle of her spine.  It has been there for 1 day.  It hurts to bend over.  It hurts to twist.  She denies any specific injury.  She has not tried any medication for it.  She denies any neuropathic symptoms in her legs.  Problem #3 is overactive bladder.  This is been an issue for several years.  She has frequency and urgency multiple times throughout the day.  She occasionally has urge incontinence.  She denies any dysuria or hematuria or fever.  It is compromising her quality of life Past Medical History:  Diagnosis Date  . Depression   . Dysphagia   . GERD (gastroesophageal reflux disease)   . Headache(784.0)   . Hypertension    Past Surgical History:  Procedure Laterality Date  . COLONOSCOPY    . ESOPHAGEAL DILATION N/A 11/20/2014   Procedure: ESOPHAGEAL DILATION;  Surgeon: Corbin Ade, MD;  [facility];  Service: Endoscopy;  Laterality: N/A;  . ESOPHAGOGASTRODUODENOSCOPY    . ESOPHAGOGASTRODUODENOSCOPY N/A 11/10/2014   WUJ:WJXBJYNWG schatzkis ringerosive reflux esophagitis  . ESOPHAGOGASTRODUODENOSCOPY N/A 11/20/2014   NFA:OZHYQMVH'Q ring s/p dilation/HH  . TRACHEOSTOMY    . TUBAL LIGATION     Current Outpatient Prescriptions on File Prior to Visit  Medication Sig Dispense Refill  . metoprolol succinate (TOPROL-XL) 50 MG 24 hr tablet TAKE ONE TABLET BY MOUTH ONCE DAILY --  TAKE  WITH  OR  IMMEDIATELY  FOLLOWING  A  MEAL 30 tablet 11   No current facility-administered medications on file prior to visit.    No Known Allergies Social History   Social History  . Marital status: Married    Spouse name: N/A  . Number of children: N/A  . Years of education: N/A   Occupational History  . Not on file.   Social History Main Topics  . Smoking status: Never Smoker  . Smokeless tobacco: Never Used  . Alcohol use No  . Drug use: No  . Sexual activity: Not on file   Other Topics Concern  . Not on file   Social History Narrative  . No narrative on file    Review of Systems  All other systems reviewed and are negative.      Objective:   Physical Exam  Constitutional: She is oriented to person, place, and time. She appears well-developed and well-nourished. No distress.  HENT:  Head: Normocephalic and atraumatic. Head is without raccoon's eyes, without Battle's sign, without abrasion, without contusion, without laceration, without right periorbital erythema and without left periorbital erythema.  Right Ear: External ear normal.  Left Ear: External ear normal.  Nose: Nose normal.  Mouth/Throat: Oropharynx is clear and moist. No oropharyngeal exudate.  Eyes: Pupils are equal, round, and reactive to light. Conjunctivae and EOM are normal.  Neck: Neck supple.  Cardiovascular: Normal rate, regular rhythm and normal heart sounds.   No murmur heard. Pulmonary/Chest: Effort normal and breath sounds normal. No respiratory distress. She has no wheezes. She has no rales.  Abdominal: Soft. Bowel sounds  are normal.  Neurological: She is alert and oriented to person, place, and time. She has normal reflexes. She displays normal reflexes. No cranial nerve deficit. She exhibits normal muscle tone. Coordination normal.  Skin: She is not diaphoretic.  Vitals reviewed.         Assessment & Plan:  Strain of mid-back, initial encounter - Plan: DG Thoracic Spine W/Swimmers  Contusion of head, unspecified part of head, initial encounter  OAB (overactive bladder)  I believe the patient has strained a muscle in the middle of her back.  I will begin by obtaining an x-ray to rule out degenerative disc disease and thoracic spine.  She can try ibuprofen as needed for pain.  I believe she suffered a contusion to her head.  I see no sign of intracranial hemorrhage or severe intracranial pathology.  There is no neurologic deficit.  There is no seizure activity.  Reassured the patient that no treatment is necessary.  I believe she also has overactive bladder.  I will start the patient on generic oxybutynin 5 mg p.o. twice daily.  We discussed the side effect profile that medication.  If the side effects are intolerable, she will have to try the Vesicare which will be more expensive.  She got her flu shot today.

## 2017-01-17 ENCOUNTER — Other Ambulatory Visit: Payer: Self-pay | Admitting: Family Medicine

## 2017-01-17 DIAGNOSIS — I10 Essential (primary) hypertension: Secondary | ICD-10-CM

## 2017-01-17 MED ORDER — METOPROLOL SUCCINATE ER 50 MG PO TB24: ORAL_TABLET | ORAL | 3 refills | Status: DC

## 2017-02-03 ENCOUNTER — Encounter: Payer: Self-pay | Admitting: Family Medicine

## 2017-02-03 ENCOUNTER — Ambulatory Visit (INDEPENDENT_AMBULATORY_CARE_PROVIDER_SITE_OTHER): Payer: Medicare HMO | Admitting: Family Medicine

## 2017-02-03 ENCOUNTER — Ambulatory Visit (HOSPITAL_COMMUNITY)
Admission: RE | Admit: 2017-02-03 | Discharge: 2017-02-03 | Disposition: A | Discharge status: Home / Self Care | Payer: 59 | Source: Ambulatory Visit | Attending: Family Medicine | Admitting: Family Medicine

## 2017-02-03 VITALS — BP 156/92 | HR 72 | Temp 98.3°F | Resp 20 | Ht 61.5 in | Wt 177.0 lb

## 2017-02-03 DIAGNOSIS — M2041 Other hammer toe(s) (acquired), right foot: Secondary | ICD-10-CM | POA: Diagnosis not present

## 2017-02-03 DIAGNOSIS — M545 Low back pain, unspecified: Secondary | ICD-10-CM

## 2017-02-03 DIAGNOSIS — I10 Essential (primary) hypertension: Secondary | ICD-10-CM

## 2017-02-03 DIAGNOSIS — G8929 Other chronic pain: Secondary | ICD-10-CM

## 2017-02-03 DIAGNOSIS — M47816 Spondylosis without myelopathy or radiculopathy, lumbar region: Secondary | ICD-10-CM | POA: Insufficient documentation

## 2017-02-03 MED ORDER — MELOXICAM 15 MG PO TABS: 15.0000 mg | ORAL_TABLET | Freq: Every day | ORAL | 0 refills | Status: DC

## 2017-02-03 MED ORDER — METOPROLOL SUCCINATE ER 50 MG PO TB24: ORAL_TABLET | ORAL | 3 refills | Status: DC

## 2017-02-03 MED ORDER — LISINOPRIL 20 MG PO TABS: 20.0000 mg | ORAL_TABLET | Freq: Every day | ORAL | 3 refills | Status: DC

## 2017-02-03 NOTE — Progress Notes (Signed)
Subjective:    Patient ID: [name], female    DOB: Apr 25, 1950, 67 y.o.   MRN: [id number]  HPI  Patient presents with 3 concerns first she has pain in her third toe on her right foot. Distal to the PIP joint is a warty hard-corn-like papule that appears to be secondary to stricturing coming from the fourth toe adjacent to it. The fourth toe hammertoe with a medial deviation of the toe distal to the PIP joint. The tip and toenail of the fourth toe rub the third toe in the area where the corn is located.  Second concern is low back pain. She states this is been present ever since the 1970s when she was not down at the [facility] and landed striking her lower back against the step. She points to a bandlike pain in her lower back around the level of L4 and L5. There is no radiation of the pain. There is no numbness or tingling in her legs or weakness in her legs. However she states that she has severe pain on a daily basis that limits her quality of life. She is tried Aleve with no benefit. Third concern is her elevated blood pressure which she was unaware of. She denies any chest pain shortness of breath or dyspnea on exertion Past Medical History:  Diagnosis Date  . Depression   . Dysphagia   . GERD (gastroesophageal reflux disease)   . Headache(784.0)   . Hypertension    Past Surgical History:  Procedure Laterality Date  . COLONOSCOPY    . ESOPHAGEAL DILATION N/A 11/20/2014   Procedure: ESOPHAGEAL DILATION;  Surgeon: Corbin Ade, MD;  Location: [facility];  Service: Endoscopy;  Laterality: N/A;  . ESOPHAGOGASTRODUODENOSCOPY    . ESOPHAGOGASTRODUODENOSCOPY N/A 11/10/2014   QIH:[id number] schatzkis ringerosive reflux esophagitis  . ESOPHAGOGASTRODUODENOSCOPY N/A 11/20/2014   OVF:IEPPIRJJ'O ring s/p dilation/HH  . TRACHEOSTOMY    . TUBAL LIGATION     No current outpatient medications on file prior to visit.   No current facility-administered medications on file prior  to visit.    No Known Allergies Social History   Socioeconomic History  . Marital status: Married    Spouse name: Not on file  . Number of children: Not on file  . Years of education: Not on file  . Highest education level: Not on file  Social Needs  . Financial resource strain: Not on file  . Food insecurity - worry: Not on file  . Food insecurity - inability: Not on file  . Transportation needs - medical: Not on file  . Transportation needs - non-medical: Not on file  Occupational History  . Not on file  Tobacco Use  . Smoking status: Never Smoker  . Smokeless tobacco: Never Used  Substance and Sexual Activity  . Alcohol use: No  . Drug use: No  . Sexual activity: Not on file  Other Topics Concern  . Not on file  Social History Narrative  . Not on file     Review of Systems  All other systems reviewed and are negative.      Objective:   Physical Exam  Cardiovascular: Normal rate, regular rhythm and normal heart sounds.  Pulmonary/Chest: Effort normal and breath sounds normal.  Musculoskeletal:       Lumbar back: She exhibits decreased range of motion, tenderness and pain. She exhibits no bony tenderness, no swelling, no edema, no deformity and no spasm.       Back:  Right foot: There is tenderness, bony tenderness and deformity.       Feet:  Vitals reviewed.         Assessment & Plan:  Chronic midline low back pain without sciatica - Plan: DG Lumbar Spine Complete  Essential hypertension - Continue Toprol, BP a little high, no meds today, no bradycardia - Plan: metoprolol succinate (TOPROL-XL) 50 MG 24 hr tablet  Acquired hammertoe of right foot - Plan: Ambulatory referral to Podiatry   I suspect that the patient has chronic underlying muscle pain in her low back likely due to degenerative disc disease. Begin Mobic 15 mg a day to help manage low back pain.  Recommended physical therapy however I will start with an x-ray of the lumbar spine to  evaluate further. Begin lisinopril 20 mg a day in addition to Toprol.  Recheck blood pressure in one month. Consult podiatry regarding the hammertoe and adjacent corn.

## 2017-02-17 ENCOUNTER — Ambulatory Visit: Payer: 59 | Admitting: Podiatry

## 2017-02-28 ENCOUNTER — Telehealth: Payer: Self-pay | Admitting: *Deleted

## 2017-02-28 NOTE — Telephone Encounter (Signed)
Pt asked if she would be able to go back to work after her toe was broke. I asked pt if she had been seen her for a broken toe and she stated she has a hammer toe and she knows they will have to break it to fix it. I told pt that in most cases postop pts are not release to return to work until after the 1st POV and it would depend on how she was recovering and her type of job duties. 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 I asked pt is she has been seen in office and she said no she has an appt, 03/03/2017. I told pt to make a list of her questions that would help her and Dr. Ardelle AntonWagoner.

## 2017-03-04 ENCOUNTER — Encounter: Payer: Self-pay | Admitting: Podiatry

## 2017-03-04 ENCOUNTER — Ambulatory Visit (INDEPENDENT_AMBULATORY_CARE_PROVIDER_SITE_OTHER): Payer: 59

## 2017-03-04 ENCOUNTER — Ambulatory Visit (INDEPENDENT_AMBULATORY_CARE_PROVIDER_SITE_OTHER): Payer: Medicare HMO | Admitting: Podiatry

## 2017-03-04 DIAGNOSIS — L84 Corns and callosities: Secondary | ICD-10-CM

## 2017-03-04 DIAGNOSIS — M79675 Pain in left toe(s): Secondary | ICD-10-CM

## 2017-03-04 DIAGNOSIS — M2041 Other hammer toe(s) (acquired), right foot: Secondary | ICD-10-CM

## 2017-03-04 DIAGNOSIS — M79674 Pain in right toe(s): Secondary | ICD-10-CM

## 2017-03-04 DIAGNOSIS — B351 Tinea unguium: Secondary | ICD-10-CM

## 2017-03-04 NOTE — Progress Notes (Signed)
Subjective:    Patient ID: [name], female    DOB: 21-Oct-1950, 67 y.o.   MRN: [id number]  HPI  67 year old female presents the office today for concerns of thick, painful, elongated toenails that she cannot trim herself.  She states her nails are yellow and has brown discoloration and they are growing funny shape.  She also has a corn on the right third toe and she states that she has hammertoes.  She said no recent treatment for either of these issues.  She has no other concerns today.  She denies any redness or warmth to her feet and she denies any other areas of pain.  She has no other concerns today.   Review of Systems  All other systems reviewed and are negative.  Past Medical History:  Diagnosis Date  . Depression   . Dysphagia   . GERD (gastroesophageal reflux disease)   . Headache(784.0)   . Hypertension     Past Surgical History:  Procedure Laterality Date  . COLONOSCOPY    . ESOPHAGEAL DILATION N/A 11/20/2014   Procedure: ESOPHAGEAL DILATION;  Surgeon: Corbin Ade, MD;  Location: [facility];  Service: Endoscopy;  Laterality: N/A;  . ESOPHAGOGASTRODUODENOSCOPY    . ESOPHAGOGASTRODUODENOSCOPY N/A 11/10/2014   EXB:[id number] schatzkis ringerosive reflux esophagitis  . ESOPHAGOGASTRODUODENOSCOPY N/A 11/20/2014   NUU:VOZDGUYQ'I ring s/p dilation/HH  . TRACHEOSTOMY    . TUBAL LIGATION       Current Outpatient Medications:  .  lisinopril (PRINIVIL,ZESTRIL) 20 MG tablet, Take 1 tablet (20 mg total) by mouth daily., Disp: 90 tablet, Rfl: 3 .  meloxicam (MOBIC) 15 MG tablet, Take 1 tablet (15 mg total) by mouth daily. (Patient not taking: Reported on 03/04/2017), Disp: 30 tablet, Rfl: 0 .  metoprolol succinate (TOPROL-XL) 50 MG 24 hr tablet, TAKE ONE TABLET BY MOUTH ONCE DAILY --  TAKE  WITH  OR  IMMEDIATELY  FOLLOWING  A  MEAL (Patient not taking: Reported on 03/04/2017), Disp: 90 tablet, Rfl: 3  No Known Allergies  Social History   Socioeconomic History  .  Marital status: Married    Spouse name: Not on file  . Number of children: Not on file  . Years of education: Not on file  . Highest education level: Not on file  Social Needs  . Financial resource strain: Not on file  . Food insecurity - worry: Not on file  . Food insecurity - inability: Not on file  . Transportation needs - medical: Not on file  . Transportation needs - non-medical: Not on file  Occupational History  . Not on file  Tobacco Use  . Smoking status: Never Smoker  . Smokeless tobacco: Never Used  Substance and Sexual Activity  . Alcohol use: No  . Drug use: No  . Sexual activity: Not on file  Other Topics Concern  . Not on file  Social History Narrative  . Not on file        Objective:   Physical Exam  General: AAO x3, NAD  Dermatological: Nails are hypertrophic, dystrophic, brittle, discolored, elongated 10.  There is incurvation of all the nails.  No surrounding redness or drainage. Tenderness nails 1-5 bilaterally.  Hyperkeratotic lesion present lateral right third toe.  Upon debridement there is no underlying ulceration, drainage or any signs of infection noted.  No open lesions or pre-ulcerative lesions are identified today.  Vascular: Dorsalis Pedis artery and Posterior Tibial artery pedal pulses are 2/4 bilateral with immedate capillary fill  time. There is no pain with calf compression, swelling, warmth, erythema.   Neruologic: Grossly intact via light touch bilateral. Protective threshold with Semmes Wienstein monofilament intact to all pedal sites bilateral.   Musculoskeletal: Hammertoe contractures are present.  There is transverse plane deformity of the second and third toes of the right foot as well.  Muscular strength 5/5 in all groups tested bilateral.  Gait: Unassisted, Nonantalgic.       Assessment & Plan:  67 year old female symptomatic onychomycosis, hyperkeratotic lesion due to digital deformity, hammertoe -Treatment options discussed  including all alternatives, risks, and complications -Etiology of symptoms were discussed -Nails debrided 10 without complications or bleeding. -Hyperkeratotic lesions were debrided x1 without any complications or bleeding -Offloading pads were dispensed.  Discussed shoe gear changes  -Daily foot inspection -Follow-up in 3 months or sooner if any problems arise. In the meantime, encouraged to call the office with any questions, concerns, change in symptoms.   Ovid Curd, DPM

## 2017-03-10 ENCOUNTER — Telehealth: Payer: Self-pay | Admitting: Family Medicine

## 2017-03-10 NOTE — Telephone Encounter (Signed)
Pt called requesting the pain med refill on pain medication for back - tried to call pt - line busy

## 2017-03-11 NOTE — Telephone Encounter (Signed)
Tried to call - line still busy

## 2017-03-14 MED ORDER — MELOXICAM 15 MG PO TABS: 15.0000 mg | ORAL_TABLET | Freq: Every day | ORAL | 0 refills | Status: DC

## 2017-03-14 NOTE — Telephone Encounter (Signed)
Pt called [name] stating that she wanted a refill on her medication for her back pain. Mobic sent to pharm.

## 2017-03-25 ENCOUNTER — Ambulatory Visit (INDEPENDENT_AMBULATORY_CARE_PROVIDER_SITE_OTHER): Payer: 59 | Admitting: Family Medicine

## 2017-03-25 VITALS — BP 132/74 | HR 65 | Temp 98.4°F | Resp 20 | Ht 61.5 in | Wt 177.0 lb

## 2017-03-25 DIAGNOSIS — Z91199 Patient's noncompliance with other medical treatment and regimen due to unspecified reason: Secondary | ICD-10-CM

## 2017-03-25 DIAGNOSIS — Z9119 Patient's noncompliance with other medical treatment and regimen: Secondary | ICD-10-CM

## 2017-03-25 NOTE — Progress Notes (Signed)
Subjective:    Patient ID: [name], female    DOB: 11/18/1950, 67 y.o.   MRN: [id number]  HPI 02/03/17 Patient presents with 3 concerns first she has pain in her third toe on her right foot. Distal to the PIP joint is a warty hard-corn-like papule that appears to be secondary to stricturing coming from the fourth toe adjacent to it. The fourth toe hammertoe with a medial deviation of the toe distal to the PIP joint. The tip and toenail of the fourth toe rub the third toe in the area where the corn is located.  Second concern is low back pain. She states this is been present ever since the 1970s when she was not down at the [facility] and landed striking her lower back against the step. She points to a bandlike pain in her lower back around the level of L4 and L5. There is no radiation of the pain. There is no numbness or tingling in her legs or weakness in her legs. However she states that she has severe pain on a daily basis that limits her quality of life. She is tried Aleve with no benefit. Third concern is her elevated blood pressure which she was unaware of. She denies any chest pain shortness of breath or dyspnea on exertion.  At that time, my plan was  I suspect that the patient has chronic underlying muscle pain in her low back likely due to degenerative disc disease. Begin Mobic 15 mg a day to help manage low back pain.  Recommended physical therapy however I will start with an x-ray of the lumbar spine to evaluate further. Begin lisinopril 20 mg a day in addition to Toprol.  Recheck blood pressure in one month. Consult podiatry regarding the hammertoe and adjacent corn.  03/25/17  X-ray revealed minimal degenerative changes.  See report below: FINDINGS: 6 non-rib-bearing lumbar vertebra.  Bones appear demineralized.  Vertebral body heights maintained without fracture or subluxation.  Disc space heights fairly well maintained.  No spondylolysis.  Facet  degenerative changes lower lumbar spine.  SI joints and hip joints preserved.  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  No acute abnormalities.  Patient is here today stating you got to give me some stronger for pain.  I asked if the patient has gone to physical therapy.  She states that she cannot go.  She has no transportation.  She states that her husband will not drive her to physical therapy.  I told the patient I find that hard to believe.  I asked if she had asked him.  She states that he does not care about her.  I explained to the patient that simply giving pain medication is not a good long-term plan to manage chronic muscular pain.  I again recommended physical therapy.  Patient states I won't go.  I told the patient that I would not prescribe pain medication unless we were working on a comprehensive plan to manage her back pain rather than just mask it.  At that point the patient became extremely upset stood up and walked out of the office standing, "I won't pay." Past Medical History:  Diagnosis Date  . Depression   . Dysphagia   . GERD (gastroesophageal reflux disease)   . Headache(784.0)   . Hypertension    Past Surgical History:  Procedure Laterality Date  . COLONOSCOPY    . ESOPHAGEAL DILATION N/A 11/20/2014   Procedure: ESOPHAGEAL DILATION;  Surgeon:  Corbin Ade, MD;  Location: [facility];  Service: Endoscopy;  Laterality: N/A;  . ESOPHAGOGASTRODUODENOSCOPY    . ESOPHAGOGASTRODUODENOSCOPY N/A 11/10/2014   DGL:OVFIEPPIR schatzkis ringerosive reflux esophagitis  . ESOPHAGOGASTRODUODENOSCOPY N/A 11/20/2014   JJO:[id number]'K ring s/p dilation/HH  . TRACHEOSTOMY    . TUBAL LIGATION     Current Outpatient Medications on File Prior to Visit  Medication Sig Dispense Refill  . meloxicam (MOBIC) 15 MG tablet Take 1 tablet (15 mg total) by mouth daily. 30 tablet 0  . metoprolol succinate (TOPROL-XL) 50 MG 24 hr tablet  TAKE ONE TABLET BY MOUTH ONCE DAILY --  TAKE  WITH  OR  IMMEDIATELY  FOLLOWING  A  MEAL 90 tablet 3   No current facility-administered medications on file prior to visit.    No Known Allergies Social History   Socioeconomic History  . Marital status: Married    Spouse name: Not on file  . Number of children: Not on file  . Years of education: Not on file  . Highest education level: Not on file  Social Needs  . Financial resource strain: Not on file  . Food insecurity - worry: Not on file  . Food insecurity - inability: Not on file  . Transportation needs - medical: Not on file  . Transportation needs - non-medical: Not on file  Occupational History  . Not on file  Tobacco Use  . Smoking status: Never Smoker  . Smokeless tobacco: Never Used  Substance and Sexual Activity  . Alcohol use: No  . Drug use: No  . Sexual activity: Not on file  Other Topics Concern  . Not on file  Social History Narrative  . Not on file     Review of Systems  Musculoskeletal: Positive for back pain.  All other systems reviewed and are negative.      Objective:   Physical Exam  Vitals reviewed.         Assessment & Plan:  This encounter was unlike anything I have ever had with this patient.  Previously we have had a very good relationship.  She seemed very upset that I would not prescribe her pain medication but she would not listen to my rationale and my plan for how to get her back pain better.  Furthermore her behavior at the office visit was unacceptable.  Simply standing up and storming out of the office without giving me a chance to formulate a plan does not constitute a working doctor patient relationship.  She will not be charged for this encounter.  However I do not see how we can continue as a doctor in a patient if she will not listen to or follow my recommendations.  I will not formally dismiss the patient at the current time.  If the patient comes back for a follow-up visit, I  will discuss what happened today further and try to establish some ground rules for moving ahead.

## 2017-04-23 ENCOUNTER — Emergency Department (HOSPITAL_COMMUNITY): Payer: 59

## 2017-04-23 ENCOUNTER — Encounter (HOSPITAL_COMMUNITY): Payer: Self-pay | Admitting: Emergency Medicine

## 2017-04-23 ENCOUNTER — Emergency Department (HOSPITAL_COMMUNITY)
Admission: EM | Admit: 2017-04-23 | Discharge: 2017-04-23 | Disposition: A | Discharge status: Home / Self Care | Payer: 59 | Attending: Emergency Medicine | Admitting: Emergency Medicine

## 2017-04-23 ENCOUNTER — Other Ambulatory Visit: Payer: Self-pay

## 2017-04-23 DIAGNOSIS — Z79899 Other long term (current) drug therapy: Secondary | ICD-10-CM | POA: Insufficient documentation

## 2017-04-23 DIAGNOSIS — R1010 Upper abdominal pain, unspecified: Secondary | ICD-10-CM | POA: Diagnosis present

## 2017-04-23 DIAGNOSIS — K5900 Constipation, unspecified: Secondary | ICD-10-CM | POA: Diagnosis not present

## 2017-04-23 DIAGNOSIS — R1013 Epigastric pain: Secondary | ICD-10-CM | POA: Diagnosis not present

## 2017-04-23 DIAGNOSIS — H81399 Other peripheral vertigo, unspecified ear: Secondary | ICD-10-CM | POA: Insufficient documentation

## 2017-04-23 HISTORY — DX: Pain in left shoulder: M25.512

## 2017-04-23 HISTORY — DX: Dorsalgia, unspecified: M54.9

## 2017-04-23 HISTORY — DX: Other chronic pain: G89.29

## 2017-04-23 HISTORY — DX: Dizziness and giddiness: R42

## 2017-04-23 LAB — COMPREHENSIVE METABOLIC PANEL
ALT: 14 U/L (ref 14–54)
ANION GAP: 11 (ref 5–15)
AST: 18 U/L (ref 15–41)
Albumin: 3.9 g/dL (ref 3.5–5.0)
Alkaline Phosphatase: 86 U/L (ref 38–126)
BUN: 10 mg/dL (ref 6–20)
CHLORIDE: 104 mmol/L (ref 101–111)
CO2: 23 mmol/L (ref 22–32)
CREATININE: 0.68 mg/dL (ref 0.44–1.00)
Calcium: 9.1 mg/dL (ref 8.9–10.3)
Glucose, Bld: 101 mg/dL — ABNORMAL HIGH (ref 65–99)
Potassium: 3.8 mmol/L (ref 3.5–5.1)
Sodium: 138 mmol/L (ref 135–145)
Total Bilirubin: 0.6 mg/dL (ref 0.3–1.2)
Total Protein: 7.4 g/dL (ref 6.5–8.1)

## 2017-04-23 LAB — CBC WITH DIFFERENTIAL/PLATELET
BASOS ABS: 0 10*3/uL (ref 0.0–0.1)
BASOS PCT: 1 %
EOS PCT: 3 %
Eosinophils Absolute: 0.2 10*3/uL (ref 0.0–0.7)
HEMATOCRIT: 43.8 % (ref 36.0–46.0)
Hemoglobin: 13.7 g/dL (ref 12.0–15.0)
Lymphocytes Relative: 31 %
Lymphs Abs: 2.1 10*3/uL (ref 0.7–4.0)
MCH: 28.9 pg (ref 26.0–34.0)
MCHC: 31.3 g/dL (ref 30.0–36.0)
MCV: 92.4 fL (ref 78.0–100.0)
MONO ABS: 0.6 10*3/uL (ref 0.1–1.0)
Monocytes Relative: 9 %
NEUTROS ABS: 3.9 10*3/uL (ref 1.7–7.7)
Neutrophils Relative %: 56 %
PLATELETS: 306 10*3/uL (ref 150–400)
RBC: 4.74 MIL/uL (ref 3.87–5.11)
RDW: 14 % (ref 11.5–15.5)
WBC: 6.9 10*3/uL (ref 4.0–10.5)

## 2017-04-23 LAB — URINALYSIS, ROUTINE W REFLEX MICROSCOPIC
BILIRUBIN URINE: NEGATIVE
Bacteria, UA: NONE SEEN
Glucose, UA: NEGATIVE mg/dL
KETONES UR: NEGATIVE mg/dL
LEUKOCYTES UA: NEGATIVE
NITRITE: NEGATIVE
PH: 7 (ref 5.0–8.0)
Protein, ur: NEGATIVE mg/dL
Specific Gravity, Urine: 1.004 — ABNORMAL LOW (ref 1.005–1.030)

## 2017-04-23 LAB — TROPONIN I

## 2017-04-23 LAB — LIPASE, BLOOD: LIPASE: 26 U/L (ref 11–51)

## 2017-04-23 MED ORDER — PANTOPRAZOLE SODIUM 40 MG IV SOLR
40.0000 mg | Freq: Once | INTRAVENOUS | Status: AC
  Administered 2017-04-23: 40 mg via INTRAVENOUS
  Filled 2017-04-23: qty 40

## 2017-04-23 MED ORDER — FAMOTIDINE IN NACL 20-0.9 MG/50ML-% IV SOLN
20.0000 mg | Freq: Once | INTRAVENOUS | Status: AC
  Administered 2017-04-23: 20 mg via INTRAVENOUS
  Filled 2017-04-23: qty 50

## 2017-04-23 MED ORDER — MECLIZINE HCL 25 MG PO TABS: 25.0000 mg | ORAL_TABLET | Freq: Three times a day (TID) | ORAL | 0 refills | Status: DC | PRN

## 2017-04-23 MED ORDER — FAMOTIDINE 20 MG PO TABS: 20.0000 mg | ORAL_TABLET | Freq: Two times a day (BID) | ORAL | 0 refills | Status: DC

## 2017-04-23 MED ORDER — MECLIZINE HCL 12.5 MG PO TABS
25.0000 mg | ORAL_TABLET | Freq: Once | ORAL | Status: AC
  Administered 2017-04-23: 25 mg via ORAL
  Filled 2017-04-23: qty 2

## 2017-04-23 NOTE — ED Notes (Signed)
Patient transported to Ultrasound 

## 2017-04-23 NOTE — ED Notes (Signed)
Pt was informed that we need a urine sample. Pt states that she can not urinate at this time. 

## 2017-04-23 NOTE — Discharge Instructions (Signed)
Eat a bland diet, avoiding greasy, fatty, fried foods, as well as spicy and acidic foods or beverages.  Avoid eating within 2 to 3 hours before going to bed or laying down.  Also avoid teas, colas, coffee, chocolate, pepermint and spearment. Avoid ibuprofen and aspirin, as well as products that contain those medications, until you are seen in follow up.  Increase the fiber and fluids in your diet.  Take the prescriptions as directed.  May also take over the counter maalox/mylanta, as directed on packaging, as needed for discomfort.  Call your regular medical doctor and your GI doctor on Monday to schedule a follow up appointment this week.  Return to the Emergency Department immediately if worsening.

## 2017-04-23 NOTE — ED Provider Notes (Signed)
[facility] EMERGENCY DEPARTMENT Provider Note   CSN: [id number] Arrival date & time: 04/23/17  1040     History   Chief Complaint Chief Complaint  Patient presents with  . Abdominal Pain    HPI [name] is a 67 y.o. female.  HPI  Pt was seen at 1105.  Per pt, c/o gradual onset and persistence of constant upper abd "pain" since yesterday.  Pt states her symptoms began after she "drank a cup of coffee." Has been associated with nausea.  Describes the abd pain as "burning." Pt also states she has been taking "4 ibuprofen" regularly, several times daily, for the past few weeks to treat her chronic low back pain.  Pt also c/o acute flair of her chronic vertigo that has occurred intermittently for the past few days. States she does not have any more antivert rx at home. Denies vomiting/diarrhea, no fevers, no back pain, no rash, no CP/SOB, no black or blood in stools.      Past Medical History:  Diagnosis Date  . Chronic back pain   . Chronic pain in left shoulder   . Depression   . Dysphagia   . GERD (gastroesophageal reflux disease)   . Headache(784.0)   . Hypertension   . Vertigo     Patient Active Problem List   Diagnosis Date Noted  . Cervical disc disease 04/14/2016  . MDD (major depressive disorder) 03/27/2015  . Grief reaction 03/27/2015  . Dysphagia   . Gastric ulceration   . Esophageal reflux   . Hiatal hernia   . Schatzki's ring     Past Surgical History:  Procedure Laterality Date  . COLONOSCOPY    . ESOPHAGEAL DILATION N/A 11/20/2014   Procedure: ESOPHAGEAL DILATION;  Surgeon: Corbin Ade, MD;  Location: [facility] ENDO SUITE;  Service: Endoscopy;  Laterality: N/A;  . ESOPHAGOGASTRODUODENOSCOPY    . ESOPHAGOGASTRODUODENOSCOPY N/A 11/10/2014   [id number] schatzkis ringerosive reflux esophagitis  . ESOPHAGOGASTRODUODENOSCOPY N/A 11/20/2014   [id number] ring s/p dilation/HH  . TRACHEOSTOMY    . TUBAL LIGATION       OB History   None       Home Medications    Prior to Admission medications   Medication Sig Start Date End Date Taking? Authorizing Provider  meloxicam (MOBIC) 15 MG tablet Take 1 tablet (15 mg total) by mouth daily. 03/14/17  Yes Donita Brooks, MD  metoprolol succinate (TOPROL-XL) 50 MG 24 hr tablet TAKE ONE TABLET BY MOUTH ONCE DAILY --  TAKE  WITH  OR  IMMEDIATELY  FOLLOWING  A  MEAL 02/03/17  Yes Donita Brooks, MD    Family History History reviewed. No pertinent family history.  Social History Social History   Tobacco Use  . Smoking status: Never Smoker  . Smokeless tobacco: Never Used  Substance Use Topics  . Alcohol use: No  . Drug use: No     Allergies   Patient has no known allergies.   Review of Systems Review of Systems ROS: Statement: All systems negative except as marked or noted in the HPI; Constitutional: Negative for fever and chills. ; ; Eyes: Negative for eye pain, redness and discharge. ; ; ENMT: Negative for ear pain, hoarseness, nasal congestion, sinus pressure and sore throat. ; ; Cardiovascular: Negative for chest pain, palpitations, diaphoresis, dyspnea and peripheral edema. ; ; Respiratory: Negative for cough, wheezing and stridor. ; ; Gastrointestinal: +nausea, upper abd pain. Negative for vomiting, diarrhea, blood in stool, hematemesis, jaundice and  rectal bleeding. . ; ; Genitourinary: Negative for dysuria, flank pain and hematuria. ; ; Musculoskeletal: Negative for back pain and neck pain. Negative for swelling and trauma.; ; Skin: Negative for pruritus, rash, abrasions, blisters, bruising and skin lesion.; ; Neuro: +vertigo. Negative for headache, lightheadedness and neck stiffness. Negative for weakness, altered level of consciousness, altered mental status, extremity weakness, paresthesias, involuntary movement, seizure and syncope.       Physical Exam Updated Vital Signs BP (!) 161/86   Pulse 65   Temp 99.1 F (37.3 C) (Oral)   Resp 15   Ht 5\' 1"  (1.549  m)   Wt 77.1 kg (170 lb)   SpO2 99%   BMI 32.12 kg/m    BP (!) 181/74   Pulse 63   Temp 99.1 F (37.3 C) (Oral)   Resp 19   Ht 5\' 1"  (1.549 m)   Wt 77.1 kg (170 lb)   SpO2 97%   BMI 32.12 kg/m    11:57 Orthostatic Vital Signs TH  Orthostatic Lying   BP- Lying: 179/75   Pulse- Lying: 65       Orthostatic Sitting  BP- Sitting: 180/78   Pulse- Sitting: 68       Orthostatic Standing at 0 minutes  BP- Standing at 0 minutes: 161/86   Pulse- Standing at 0 minutes: 73      Physical Exam 1110: Physical examination:  Nursing notes reviewed; Vital signs and O2 SAT reviewed;  Constitutional: Well developed, Well nourished, Well hydrated, In no acute distress; Head:  Normocephalic, atraumatic; Eyes: EOMI, PERRL, No scleral icterus; ENMT: TM's clear bilat. Mouth and pharynx normal, Mucous membranes moist; Neck: Supple, Full range of motion, No lymphadenopathy; Cardiovascular: Regular rate and rhythm, No gallop; Respiratory: Breath sounds clear & equal bilaterally, No wheezes.  Speaking full sentences with ease, Normal respiratory effort/excursion; Chest: Nontender, Movement normal; Abdomen: Soft, +mid-epigastric tenderness to palp. No rebound or guarding. Nondistended, Normal bowel sounds; Genitourinary: No CVA tenderness; Extremities: Peripheral pulses normal, No tenderness, No edema, No calf edema or asymmetry.; Neuro: AA&Ox3, Major CN grossly intact. +left horizontal end gaze fatigable nystagmus which reproduces pt's symptoms. Speech clear. No gross focal motor or sensory deficits in extremities.; Skin: Color normal, Warm, Dry.   ED Treatments / Results  Labs (all labs ordered are listed, but only abnormal results are displayed)   EKG None  Radiology   Procedures Procedures (including critical care time)  Medications Ordered in ED Medications  famotidine (PEPCID) IVPB 20 mg premix (0 mg Intravenous Stopped 04/23/17 1152)  pantoprazole (PROTONIX) injection 40 mg (40 mg  Intravenous Given 04/23/17 1131)  meclizine (ANTIVERT) tablet 25 mg (25 mg Oral Given 04/23/17 1131)     Initial Impression / Assessment and Plan / ED Course  I have reviewed the triage vital signs and the nursing notes.  Pertinent labs & imaging results that were available during my care of the patient were reviewed by me and considered in my medical decision making (see chart for details).  MDM Reviewed: previous chart, nursing note and vitals Reviewed previous: labs Interpretation: labs, ECG, x-ray, CT scan and ultrasound    ED ECG REPORT   Date: 04/23/2017  Rate: 66  Rhythm: normal sinus rhythm  QRS Axis: left  Intervals: PR prolonged  ST/T Wave abnormalities: normal  Conduction Disutrbances:none  Narrative Interpretation:   Old EKG Reviewed: none available.  Results for orders placed or performed during the hospital encounter of 04/23/17  Comprehensive metabolic panel  Result Value  Ref Range   Sodium 138 135 - 145 mmol/L   Potassium 3.8 3.5 - 5.1 mmol/L   Chloride 104 101 - 111 mmol/L   CO2 23 22 - 32 mmol/L   Glucose, Bld 101 (H) 65 - 99 mg/dL   BUN 10 6 - 20 mg/dL   Creatinine, Ser 9.56 0.44 - 1.00 mg/dL   Calcium 9.1 8.9 - 21.3 mg/dL   Total Protein 7.4 6.5 - 8.1 g/dL   Albumin 3.9 3.5 - 5.0 g/dL   AST 18 15 - 41 U/L   ALT 14 14 - 54 U/L   Alkaline Phosphatase 86 38 - 126 U/L   Total Bilirubin 0.6 0.3 - 1.2 mg/dL   GFR calc non Af Amer >60 >60 mL/min   GFR calc Af Amer >60 >60 mL/min   Anion gap 11 5 - 15  Lipase, blood  Result Value Ref Range   Lipase 26 11 - 51 U/L  Troponin I  Result Value Ref Range   Troponin I <0.03 <0.03 ng/mL  CBC with Differential  Result Value Ref Range   WBC 6.9 4.0 - 10.5 K/uL   RBC 4.74 3.87 - 5.11 MIL/uL   Hemoglobin 13.7 12.0 - 15.0 g/dL   HCT 08.6 57.8 - 46.9 %   MCV 92.4 78.0 - 100.0 fL   MCH 28.9 26.0 - 34.0 pg   MCHC 31.3 30.0 - 36.0 g/dL   RDW 62.9 52.8 - 41.3 %   Platelets 306 150 - 400 K/uL   Neutrophils  Relative % 56 %   Neutro Abs 3.9 1.7 - 7.7 K/uL   Lymphocytes Relative 31 %   Lymphs Abs 2.1 0.7 - 4.0 K/uL   Monocytes Relative 9 %   Monocytes Absolute 0.6 0.1 - 1.0 K/uL   Eosinophils Relative 3 %   Eosinophils Absolute 0.2 0.0 - 0.7 K/uL   Basophils Relative 1 %   Basophils Absolute 0.0 0.0 - 0.1 K/uL  Urinalysis, Routine w reflex microscopic  Result Value Ref Range   Color, Urine STRAW (A) YELLOW   APPearance CLEAR CLEAR   Specific Gravity, Urine 1.004 (L) 1.005 - 1.030   pH 7.0 5.0 - 8.0   Glucose, UA NEGATIVE NEGATIVE mg/dL   Hgb urine dipstick SMALL (A) NEGATIVE   Bilirubin Urine NEGATIVE NEGATIVE   Ketones, ur NEGATIVE NEGATIVE mg/dL   Protein, ur NEGATIVE NEGATIVE mg/dL   Nitrite NEGATIVE NEGATIVE   Leukocytes, UA NEGATIVE NEGATIVE   RBC / HPF 0-5 0 - 5 RBC/hpf   WBC, UA 0-5 0 - 5 WBC/hpf   Bacteria, UA NONE SEEN NONE SEEN   Squamous Epithelial / LPF 0-5 (A) NONE SEEN   Dg Abd Acute W/chest Result Date: 04/23/2017 CLINICAL DATA:  Dizziness and abdominal pain. EXAM: DG ABDOMEN ACUTE W/ 1V CHEST COMPARISON:  None. FINDINGS: The heart, hila, and mediastinum are normal. No pneumothorax. No pulmonary nodules or masses. No focal infiltrates. No free air, portal venous gas, or pneumatosis identified on today's study. No renal stones are noted. There is a paucity of bowel gas. Moderate fecal loading in the colon. No evidence of obstruction. IMPRESSION: Moderate to severe fecal loading in the colon. No other acute abnormalities identified. Electronically Signed   By: Gerome Sam III M.D   On: 04/23/2017 11:52   Ct Head Wo Contrast  Result Date: 04/23/2017 CLINICAL DATA:  Episodic vertigo. EXAM: CT HEAD WITHOUT CONTRAST TECHNIQUE: Contiguous axial images were obtained from the base of the skull through the  vertex without intravenous contrast. COMPARISON:  CT head dated April 15, 2008. FINDINGS: Brain: No evidence of acute infarction, hemorrhage, hydrocephalus, extra-axial  collection or mass lesion/mass effect. Slightly progressed mild generalized cerebral atrophy and chronic microvascular ischemic changes. Vascular: No hyperdense vessel or unexpected calcification. Skull: Normal. Negative for fracture or focal lesion. Sinuses/Orbits: No acute finding. Other: None. IMPRESSION: 1.  No acute intracranial abnormality. 2. Slightly progressed mild cerebral atrophy and chronic microvascular ischemic changes. Electronically Signed   By: Obie DredgeWilliam T Derry M.D.   On: 04/23/2017 12:37   [place] Abdomen Complete Result Date: 04/23/2017 CLINICAL DATA:  Upper abdominal pain and nausea for 2 days, fell on concrete on Saturday, history hypertension, GERD EXAM: ABDOMEN ULTRASOUND COMPLETE COMPARISON:  None FINDINGS: Gallbladder: Large shadowing calculus within gallbladder 28 mm diameter. Gallbladder normally distended. No wall thickening or pericholecystic fluid. No sonographic Murphy sign. Common bile duct: Diameter: 6 mm diameter, upper normal Liver: Echogenic parenchyma, likely fatty infiltration though this can be seen with cirrhosis and certain infiltrative disorders. No definite hepatic mass or nodularity identified though assessment of intrahepatic detail is limited by sound attenuation. Portal vein is patent on color Doppler imaging with normal direction of blood flow towards the liver. IVC: Poorly visualized due to sound attenuation by echogenic liver and body habitus Pancreas: Visualized portion of pancreatic head and body normal appearance with remainder obscured by bowel gas Spleen: Normal appearance, 7.1 cm length Right Kidney: Length: 10.4 cm. Normal morphology without mass or hydronephrosis. Left Kidney: Length: 9.9 cm. Normal morphology without mass or hydronephrosis. Abdominal aorta: Limited visualization due to bowel gas. Visualized portion grossly normal caliber without aneurysm. Other findings: No free fluid IMPRESSION: Probable fatty infiltration of liver as above. Suboptimal  assessment of intrahepatic detail. Cholelithiasis without evidence acute cholecystitis or biliary dilatation. Incomplete visualization of IVC, pancreas and aorta. Electronically Signed   By: Ulyses SouthwardMark  Boles M.D.   On: 04/23/2017 13:29    1420:  Pt has tol PO well while in the ED without N/V.  No stooling while in the ED.  Abd benign, VSS. Pt has ambulated with steady gait, easy resps, NAD. Feels better and wants to go home now. Tx symptomatically, f/u GI MD and PMD. Dx and testing d/w pt and family.  Questions answered.  Verb understanding, agreeable to d/c home with outpt f/u.      Final Clinical Impressions(s) / ED Diagnoses   Final diagnoses:  None    ED Discharge Orders    None       Samuel JesterMcManus, Kaysi Ourada, DO 04/27/17 1734

## 2017-04-23 NOTE — ED Notes (Signed)
Pt able to ambulate without assistance. Slight drag on right foot bu pt states this is baseline due to injury when she was a child

## 2017-04-23 NOTE — ED Triage Notes (Signed)
Patient complaining of upper abominal pain with nausea x 2 days. Denies vomiting or diarrhea. States last bowel movement was 2-3 days ago. Denies dysuria.

## 2017-04-23 NOTE — ED Notes (Signed)
Pt tolerated PO challenge well.

## 2017-06-02 ENCOUNTER — Ambulatory Visit: Payer: Medicare Other | Admitting: Podiatry

## 2017-10-25 ENCOUNTER — Encounter: Payer: Self-pay | Admitting: Internal Medicine

## 2018-02-21 ENCOUNTER — Other Ambulatory Visit: Payer: Self-pay | Admitting: Family Medicine

## 2018-02-21 DIAGNOSIS — I10 Essential (primary) hypertension: Secondary | ICD-10-CM

## 2018-03-21 ENCOUNTER — Other Ambulatory Visit: Payer: Self-pay | Admitting: Family Medicine

## 2018-03-21 MED ORDER — MELOXICAM 15 MG PO TABS: 15.0000 mg | ORAL_TABLET | Freq: Every day | ORAL | 2 refills | Status: DC

## 2018-03-21 NOTE — Telephone Encounter (Signed)
Pt called requesting a refill on the medication for her back - Meloxicam - and she would like it delivered from [facility]. Med refilled and note put on rx to deliver.

## 2018-04-28 ENCOUNTER — Telehealth: Payer: Self-pay | Admitting: Family Medicine

## 2018-04-28 NOTE — Telephone Encounter (Signed)
Got in touch with pt and apt made

## 2018-04-28 NOTE — Telephone Encounter (Signed)
Pt called and left message on vm stating that the medication for her ha's is not helping and would like something different. Per Dr. Tanya Nones pt must be seen in order to give new medication. Tried to call pt - no answer and no vm.

## 2018-05-01 ENCOUNTER — Ambulatory Visit (INDEPENDENT_AMBULATORY_CARE_PROVIDER_SITE_OTHER): Payer: Medicare Other | Admitting: Family Medicine

## 2018-05-01 ENCOUNTER — Encounter: Payer: Self-pay | Admitting: Family Medicine

## 2018-05-01 ENCOUNTER — Other Ambulatory Visit: Payer: Self-pay

## 2018-05-01 VITALS — BP 130/80 | HR 66 | Temp 98.4°F | Resp 18 | Ht 61.5 in | Wt 183.0 lb

## 2018-05-01 DIAGNOSIS — M545 Low back pain, unspecified: Secondary | ICD-10-CM

## 2018-05-01 DIAGNOSIS — G8929 Other chronic pain: Secondary | ICD-10-CM | POA: Diagnosis not present

## 2018-05-01 MED ORDER — ALBUTEROL SULFATE HFA 108 (90 BASE) MCG/ACT IN AERS: 2.0000 | INHALATION_SPRAY | Freq: Four times a day (QID) | RESPIRATORY_TRACT | 0 refills | Status: DC | PRN

## 2018-05-01 MED ORDER — CYCLOBENZAPRINE HCL 10 MG PO TABS: 10.0000 mg | ORAL_TABLET | Freq: Three times a day (TID) | ORAL | 0 refills | Status: DC | PRN

## 2018-05-01 NOTE — Progress Notes (Signed)
Subjective:    Patient ID: [name], female    DOB: 08-Oct-1950, 68 y.o.   MRN: [id number]  Back Pain    02/03/17 Patient presents with 3 concerns first she has pain in her third toe on her right foot. Distal to the PIP joint is a warty hard-corn-like papule that appears to be secondary to stricturing coming from the fourth toe adjacent to it. The fourth toe hammertoe with a medial deviation of the toe distal to the PIP joint. The tip and toenail of the fourth toe rub the third toe in the area where the corn is located.  Second concern is low back pain. She states this is been present ever since the 1970s when she was not down at the [facility] and landed striking her lower back against the step. She points to a bandlike pain in her lower back around the level of L4 and L5. There is no radiation of the pain. There is no numbness or tingling in her legs or weakness in her legs. However she states that she has severe pain on a daily basis that limits her quality of life. She is tried Aleve with no benefit. Third concern is her elevated blood pressure which she was unaware of. She denies any chest pain shortness of breath or dyspnea on exertion.  At that time, my plan was  I suspect that the patient has chronic underlying muscle pain in her low back likely due to degenerative disc disease. Begin Mobic 15 mg a day to help manage low back pain.  Recommended physical therapy however I will start with an x-ray of the lumbar spine to evaluate further. Begin lisinopril 20 mg a day in addition to Toprol.  Recheck blood pressure in one month. Consult podiatry regarding the hammertoe and adjacent corn.  03/25/17  X-ray revealed minimal degenerative changes.  See report below: FINDINGS: 6 non-rib-bearing lumbar vertebra.  Bones appear demineralized.  Vertebral body heights maintained without fracture or subluxation.  Disc space heights fairly well maintained.  No spondylolysis.   Facet degenerative changes lower lumbar spine.  SI joints and hip joints preserved.  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  No acute abnormalities.  Patient is here today stating you got to give me some stronger for pain.  I asked if the patient has gone to physical therapy.  She states that she cannot go.  She has no transportation.  She states that her husband will not drive her to physical therapy.  I told the patient I find that hard to believe.  I asked if she had asked him.  She states that he does not care about her.  I explained to the patient that simply giving pain medication is not a good long-term plan to manage chronic muscular pain.  I again recommended physical therapy.  Patient states I won't go.  I told the patient that I would not prescribe pain medication unless we were working on a comprehensive plan to manage her back pain rather than just mask it.  At that point the patient became extremely upset stood up and walked out of the office standing, "I won't pay."  At that time, my plan was: This encounter was unlike anything I have ever had with this patient.  Previously we have had a very good relationship.  She seemed very upset that I would not prescribe her pain medication but she would not listen to my rationale and my plan  for how to get her back pain better.  Furthermore her behavior at the office visit was unacceptable.  Simply standing up and storming out of the office without giving me a chance to formulate a plan does not constitute a working doctor patient relationship.  She will not be charged for this encounter.  However I do not see how we can continue as a doctor in a patient if she will not listen to or follow my recommendations.  I will not formally dismiss the patient at the current time.  If the patient comes back for a follow-up visit, I will discuss what happened today further and try to establish some ground  rules for moving ahead.    05/01/18 Patient continues to complain of pain in her lower back.  Pain is made worse when she stands for prolonged period of time such as standing at the sink to wash dishes.  The pain stays in a bandlike fashion in her lower back around the level of L4-L5.  She denies any sciatica.  She denies any leg weakness or numbness in the legs.  She denies any bowel or bladder incontinence.  Previous x-ray showed only minimal degenerative changes.  She denies any symptoms of lumbar radiculopathy.  She denies any fevers or chills or weight loss.  She has been taking meloxicam every day however and has now developed indigestion.  She denies any melena or hematochezia.  She would also like an inhaler.  She wheezes whenever she tries to do any physical activity.  She also wheezes and coughs when she walks to the mailbox.  She would like an inhaler that she could use as needed for the wheezing Past Medical History:  Diagnosis Date  . Chronic back pain   . Chronic pain in left shoulder   . Depression   . Dysphagia   . GERD (gastroesophageal reflux disease)   . Headache(784.0)   . Hypertension   . Vertigo    Past Surgical History:  Procedure Laterality Date  . COLONOSCOPY    . ESOPHAGEAL DILATION N/A 11/20/2014   Procedure: ESOPHAGEAL DILATION;  Surgeon: Corbin Ade, MD;  Location: [facility];  Service: Endoscopy;  Laterality: N/A;  . ESOPHAGOGASTRODUODENOSCOPY    . ESOPHAGOGASTRODUODENOSCOPY N/A 11/10/2014   [id number] schatzkis ringerosive reflux esophagitis  . ESOPHAGOGASTRODUODENOSCOPY N/A 11/20/2014   WJX:[id number] ring s/p dilation/HH  . TRACHEOSTOMY    . TUBAL LIGATION     Current Outpatient Medications on File Prior to Visit  Medication Sig Dispense Refill  . famotidine (PEPCID) 20 MG tablet Take 1 tablet (20 mg total) by mouth 2 (two) times daily. 30 tablet 0  . meclizine (ANTIVERT) 25 MG tablet Take 1 tablet (25 mg total) by mouth 3 (three) times daily as  needed for dizziness. 15 tablet 0  . meloxicam (MOBIC) 15 MG tablet Take 1 tablet (15 mg total) by mouth daily. 30 tablet 2  . metoprolol succinate (TOPROL-XL) 50 MG 24 hr tablet TAKE (1) TABLET BY MOUTH ONCE DAILY. TAKE WITH OR IMMEDIATELY FOLLOWING A MEAL. 90 tablet 2   No current facility-administered medications on file prior to visit.    No Known Allergies Social History   Socioeconomic History  . Marital status: Married    Spouse name: Not on file  . Number of children: Not on file  . Years of education: Not on file  . Highest education level: Not on file  Occupational History  . Not on file  Social Needs  .  Financial resource strain: Not on file  . Food insecurity:    Worry: Not on file    Inability: Not on file  . Transportation needs:    Medical: Not on file    Non-medical: Not on file  Tobacco Use  . Smoking status: Never Smoker  . Smokeless tobacco: Never Used  Substance and Sexual Activity  . Alcohol use: No  . Drug use: No  . Sexual activity: Not on file  Lifestyle  . Physical activity:    Days per week: Not on file    Minutes per session: Not on file  . Stress: Not on file  Relationships  . Social connections:    Talks on phone: Not on file    Gets together: Not on file    Attends religious service: Not on file    Active member of club or organization: Not on file    Attends meetings of clubs or organizations: Not on file    Relationship status: Not on file  . Intimate partner violence:    Fear of current or ex partner: Not on file    Emotionally abused: Not on file    Physically abused: Not on file    Forced sexual activity: Not on file  Other Topics Concern  . Not on file  Social History Narrative  . Not on file     Review of Systems  Musculoskeletal: Positive for back pain.  All other systems reviewed and are negative.      Objective:   Physical Exam  Constitutional: She appears well-developed and well-nourished.  Cardiovascular:  Normal rate and regular rhythm.  Pulmonary/Chest: Effort normal and breath sounds normal.  Musculoskeletal:     Right shoulder: She exhibits decreased range of motion and pain.  Vitals reviewed.         Assessment & Plan:  Chronic midline low back pain without sciatica  Recommend she discontinue meloxicam as I believe this is likely causing her gastrointestinal upset.  Would recommend trying Flexeril 10 mg every 8 hours as needed for muscle spasms and muscle pain.  Continue to recommend physical therapy which she declines.  Recommend that she wear a back brace every day and also recommended that she use antifatigue mats at the sink in other places where she stands to help reduce the fatigue and muscle strain in her lower back.  We will give her albuterol to use as needed for wheezing.  If this does not help, I would recommend an evaluation for possible cardiac causes of shortness of breath and wheezing

## 2018-05-30 ENCOUNTER — Telehealth: Payer: Self-pay | Admitting: Family Medicine

## 2018-05-30 DIAGNOSIS — S62607A Fracture of unspecified phalanx of left little finger, initial encounter for closed fracture: Secondary | ICD-10-CM

## 2018-05-30 NOTE — Addendum Note (Signed)
Addended by: Legrand Rams B on: 05/30/2018 02:31 PM   Modules accepted: Orders

## 2018-05-30 NOTE — Telephone Encounter (Signed)
Pt states that while her and her husband were playing around he accidentally hit her finger and it is hanging down from the 1 st joint. This happened over a week ago and she states that it is now painful. Per Dr. Tanya Nones ok to get xray. Xray ordered and pt will go to [facility] for xray.

## 2018-05-30 NOTE — Telephone Encounter (Signed)
Broken left finger ? (Closer to [facility])

## 2018-06-13 ENCOUNTER — Ambulatory Visit (INDEPENDENT_AMBULATORY_CARE_PROVIDER_SITE_OTHER): Payer: Medicare Other | Admitting: Family Medicine

## 2018-06-13 ENCOUNTER — Other Ambulatory Visit: Payer: Self-pay

## 2018-06-13 ENCOUNTER — Encounter: Payer: Self-pay | Admitting: Family Medicine

## 2018-06-13 VITALS — BP 130/82 | HR 71 | Temp 98.5°F | Resp 18 | Ht 61.0 in | Wt 188.6 lb

## 2018-06-13 DIAGNOSIS — M7989 Other specified soft tissue disorders: Secondary | ICD-10-CM | POA: Diagnosis not present

## 2018-06-13 DIAGNOSIS — T7491XA Unspecified adult maltreatment, confirmed, initial encounter: Secondary | ICD-10-CM | POA: Insufficient documentation

## 2018-06-13 DIAGNOSIS — I10 Essential (primary) hypertension: Secondary | ICD-10-CM | POA: Diagnosis not present
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
## 2018-06-13 NOTE — Assessment & Plan Note (Signed)
Controlled no changes 

## 2018-06-13 NOTE — Patient Instructions (Signed)
Take the meloxciam for your back We will call with lab results  F/U pending results

## 2018-06-14 ENCOUNTER — Other Ambulatory Visit: Payer: Self-pay

## 2018-06-14 DIAGNOSIS — R609 Edema, unspecified: Secondary | ICD-10-CM

## 2018-06-14 LAB — BASIC METABOLIC PANEL
BUN/Creatinine Ratio: 14 (calc) (ref 6–22)
BUN: 14 mg/dL (ref 7–25)
CO2: 27 mmol/L (ref 20–32)
Calcium: 9.7 mg/dL (ref 8.6–10.4)
Chloride: 103 mmol/L (ref 98–110)
Creat: 1.01 mg/dL — ABNORMAL HIGH (ref 0.50–0.99)
Glucose, Bld: 77 mg/dL (ref 65–99)
Potassium: 4.4 mmol/L (ref 3.5–5.3)
Sodium: 140 mmol/L (ref 135–146)

## 2018-06-14 LAB — D-DIMER, QUANTITATIVE: D-Dimer, Quant: 0.48 mcg/mL FEU (ref <0.50)

## 2018-06-14 LAB — CBC WITH DIFFERENTIAL/PLATELET
Absolute Monocytes: 742 cells/uL (ref 200–950)
Basophils Absolute: 58 cells/uL (ref 0–200)
Basophils Relative: 0.8 %
Eosinophils Absolute: 238 cells/uL (ref 15–500)
Eosinophils Relative: 3.3 %
HCT: 44 % (ref 35.0–45.0)
Hemoglobin: 14.2 g/dL (ref 11.7–15.5)
Lymphs Abs: 1973 cells/uL (ref 850–3900)
MCH: 29.1 pg (ref 27.0–33.0)
MCHC: 32.3 g/dL (ref 32.0–36.0)
MCV: 90.2 fL (ref 80.0–100.0)
MPV: 10.8 fL (ref 7.5–12.5)
Monocytes Relative: 10.3 %
Neutro Abs: 4190 cells/uL (ref 1500–7800)
Neutrophils Relative %: 58.2 %
Platelets: 307 10*3/uL (ref 140–400)
RBC: 4.88 10*6/uL (ref 3.80–5.10)
RDW: 12.3 % (ref 11.0–15.0)
Total Lymphocyte: 27.4 %
WBC: 7.2 10*3/uL (ref 3.8–10.8)

## 2018-06-14 LAB — URIC ACID: Uric Acid, Serum: 5.9 mg/dL (ref 2.5–7.0)

## 2018-06-16 ENCOUNTER — Ambulatory Visit (HOSPITAL_COMMUNITY)
Admission: RE | Admit: 2018-06-16 | Discharge: 2018-06-16 | Disposition: A | Discharge status: Home / Self Care | Payer: Medicare Other | Source: Ambulatory Visit | Attending: Family Medicine | Admitting: Family Medicine

## 2018-06-16 ENCOUNTER — Other Ambulatory Visit: Payer: Self-pay

## 2018-06-16 DIAGNOSIS — R609 Edema, unspecified: Secondary | ICD-10-CM | POA: Insufficient documentation

## 2018-06-16 DIAGNOSIS — M7731 Calcaneal spur, right foot: Secondary | ICD-10-CM | POA: Diagnosis not present

## 2018-06-16 DIAGNOSIS — M19071 Primary osteoarthritis, right ankle and foot: Secondary | ICD-10-CM | POA: Diagnosis not present

## 2018-06-26 ENCOUNTER — Ambulatory Visit (INDEPENDENT_AMBULATORY_CARE_PROVIDER_SITE_OTHER): Payer: Medicare Other | Admitting: Family Medicine

## 2018-06-26 ENCOUNTER — Ambulatory Visit (HOSPITAL_COMMUNITY)
Admission: RE | Admit: 2018-06-26 | Discharge: 2018-06-26 | Disposition: A | Discharge status: Home / Self Care | Payer: Medicare Other | Source: Ambulatory Visit | Attending: Family Medicine | Admitting: Family Medicine

## 2018-06-26 ENCOUNTER — Encounter: Payer: Self-pay | Admitting: Family Medicine

## 2018-06-26 ENCOUNTER — Other Ambulatory Visit: Payer: Self-pay

## 2018-06-26 VITALS — BP 140/80 | HR 76 | Temp 98.0°F | Resp 18 | Ht 61.5 in | Wt 185.0 lb

## 2018-06-26 DIAGNOSIS — M79642 Pain in left hand: Secondary | ICD-10-CM | POA: Insufficient documentation

## 2018-06-26 DIAGNOSIS — F321 Major depressive disorder, single episode, moderate: Secondary | ICD-10-CM | POA: Diagnosis not present

## 2018-06-26 DIAGNOSIS — S62627A Displaced fracture of medial phalanx of left little finger, initial encounter for closed fracture: Secondary | ICD-10-CM | POA: Diagnosis not present

## 2018-06-26 MED ORDER — ESCITALOPRAM OXALATE 10 MG PO TABS: 10.0000 mg | ORAL_TABLET | Freq: Every day | ORAL | 1 refills | Status: DC
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
## 2018-07-19 ENCOUNTER — Telehealth: Payer: Self-pay | Admitting: Family Medicine

## 2018-07-19 MED ORDER — TETANUS-DIPHTH-ACELL PERTUSSIS 5-2-15.5 LF-MCG/0.5 IM SUSP: 0.5000 mL | Freq: Once | INTRAMUSCULAR | 0 refills | Status: AC

## 2018-07-19 NOTE — Telephone Encounter (Signed)
Pt needs [place] to call in tetanus to [facility]

## 2018-07-19 NOTE — Telephone Encounter (Signed)
Tdap sent to [facility]

## 2018-08-03 ENCOUNTER — Encounter: Payer: Self-pay | Admitting: Family Medicine

## 2018-08-03 ENCOUNTER — Other Ambulatory Visit: Payer: Self-pay

## 2018-08-03 ENCOUNTER — Ambulatory Visit (INDEPENDENT_AMBULATORY_CARE_PROVIDER_SITE_OTHER): Payer: Managed Care, Other (non HMO) | Admitting: Family Medicine

## 2018-08-03 VITALS — BP 138/78 | HR 80 | Temp 98.4°F | Resp 18 | Ht 61.5 in | Wt 192.0 lb

## 2018-08-03 DIAGNOSIS — M7989 Other specified soft tissue disorders: Secondary | ICD-10-CM

## 2018-08-03 DIAGNOSIS — G8929 Other chronic pain: Secondary | ICD-10-CM

## 2018-08-03 DIAGNOSIS — M545 Low back pain, unspecified: Secondary | ICD-10-CM

## 2018-08-03 MED ORDER — FUROSEMIDE 40 MG PO TABS: 40.0000 mg | ORAL_TABLET | Freq: Every day | ORAL | 3 refills | Status: DC | PRN

## 2018-08-03 MED ORDER — TIZANIDINE HCL 4 MG PO TABS: 4.0000 mg | ORAL_TABLET | Freq: Four times a day (QID) | ORAL | 0 refills | Status: DC | PRN

## 2018-08-04 LAB — BASIC METABOLIC PANEL WITH GFR
BUN: 17 mg/dL (ref 7–25)
CO2: 24 mmol/L (ref 20–32)
Calcium: 9.3 mg/dL (ref 8.6–10.4)
Chloride: 105 mmol/L (ref 98–110)
Creat: 0.9 mg/dL (ref 0.50–0.99)
GFR, Est African American: 76 mL/min/{1.73_m2} (ref >=60)
GFR, Est Non African American: 65 mL/min/{1.73_m2} (ref >=60)
Glucose, Bld: 101 mg/dL — ABNORMAL HIGH (ref 65–99)
Potassium: 5 mmol/L (ref 3.5–5.3)
Sodium: 139 mmol/L (ref 135–146)

## 2018-08-04 LAB — BRAIN NATRIURETIC PEPTIDE: Brain Natriuretic Peptide: 81 pg/mL (ref <100)
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
## 2018-10-16 ENCOUNTER — Ambulatory Visit (INDEPENDENT_AMBULATORY_CARE_PROVIDER_SITE_OTHER): Payer: Medicare Other | Admitting: Family Medicine

## 2018-10-16 ENCOUNTER — Other Ambulatory Visit: Payer: Self-pay

## 2018-10-16 ENCOUNTER — Encounter: Payer: Self-pay | Admitting: Family Medicine

## 2018-10-16 ENCOUNTER — Ambulatory Visit (HOSPITAL_COMMUNITY)
Admission: RE | Admit: 2018-10-16 | Discharge: 2018-10-16 | Disposition: A | Discharge status: Home / Self Care | Payer: Managed Care, Other (non HMO) | Source: Ambulatory Visit | Attending: Family Medicine | Admitting: Family Medicine

## 2018-10-16 VITALS — BP 144/82 | HR 72 | Temp 97.6°F | Resp 18 | Ht 61.5 in | Wt 184.0 lb

## 2018-10-16 DIAGNOSIS — M25562 Pain in left knee: Secondary | ICD-10-CM

## 2018-10-16 DIAGNOSIS — S8992XA Unspecified injury of left lower leg, initial encounter: Secondary | ICD-10-CM | POA: Diagnosis not present

## 2018-10-16 NOTE — Progress Notes (Signed)
Subjective:    Patient ID: [name], female    DOB: [birth year], 68 y.o.   MRN: [id number] 3 weeks ago, the patient was bending over at home picking up popcorn when she lost her balance and fell forward landing directly on her knee.  Ever since that time she has had pain and swelling around her knee.  She has a small joint effusion.  She has pain with flexion of the knee.  The knee feels better when it is fully extended.  It hurts to bear weight on the knee.  It hurts to walk although she has been walking on it for the last 3 weeks.  There is no erythema or bruising however she is tender to palpation over the medial and lateral joint lines.  There is no laxity to varus or valgus stress.  She has a negative anterior and posterior drawer sign.  She does have pain with flexion suggesting a possible meniscal tear  Past Medical History:  Diagnosis Date  . Chronic back pain   . Chronic pain in left shoulder   . Depression   . Dysphagia   . GERD (gastroesophageal reflux disease)   . Headache(784.0)   . Hypertension   . Vertigo    Past Surgical History:  Procedure Laterality Date  . COLONOSCOPY    . ESOPHAGEAL DILATION N/A 11/20/2014   Procedure: ESOPHAGEAL DILATION;  Surgeon: Corbin Ade, MD;  Location: [facility];  Service: Endoscopy;  Laterality: N/A;  . ESOPHAGOGASTRODUODENOSCOPY    . ESOPHAGOGASTRODUODENOSCOPY N/A 11/10/2014   WUJ:[id number] schatzkis ringerosive reflux esophagitis  . ESOPHAGOGASTRODUODENOSCOPY N/A 11/20/2014   NFA:[id number] ring s/p dilation/HH  . TRACHEOSTOMY    . TUBAL LIGATION     Current Outpatient Medications on File Prior to Visit  Medication Sig Dispense Refill  . albuterol (VENTOLIN HFA) 108 (90 Base) MCG/ACT inhaler Inhale 2 puffs into the lungs every 6 (six) hours as needed for wheezing or shortness of breath. 1 Inhaler 0  . escitalopram (LEXAPRO) 10 MG tablet Take 1 tablet (10 mg total) by mouth daily. 30 tablet 1  . furosemide (LASIX) 40 MG  tablet Take 1 tablet (40 mg total) by mouth daily as needed for fluid. 30 tablet 3  . meloxicam (MOBIC) 15 MG tablet Take 1 tablet (15 mg total) by mouth daily. 30 tablet 2  . metoprolol succinate (TOPROL-XL) 50 MG 24 hr tablet TAKE (1) TABLET BY MOUTH ONCE DAILY. TAKE WITH OR IMMEDIATELY FOLLOWING A MEAL. 90 tablet 2  . tiZANidine (ZANAFLEX) 4 MG tablet Take 1 tablet (4 mg total) by mouth every 6 (six) hours as needed for muscle spasms. 30 tablet 0   No current facility-administered medications on file prior to visit.    No Known Allergies Social History   Socioeconomic History  . Marital status: Married    Spouse name: Not on file  . Number of children: Not on file  . Years of education: Not on file  . Highest education level: Not on file  Occupational History  . Not on file  Social Needs  . Financial resource strain: Not on file  . Food insecurity    Worry: Not on file    Inability: Not on file  . Transportation needs    Medical: Not on file    Non-medical: Not on file  Tobacco Use  . Smoking status: Never Smoker  . Smokeless tobacco: Never Used  Substance and Sexual Activity  . Alcohol use: No  . Drug  use: No  . Sexual activity: Not on file  Lifestyle  . Physical activity    Days per week: Not on file    Minutes per session: Not on file  . Stress: Not on file  Relationships  . Social Musician on phone: Not on file    Gets together: Not on file    Attends religious service: Not on file    Active member of club or organization: Not on file    Attends meetings of clubs or organizations: Not on file    Relationship status: Not on file  . Intimate partner violence    Fear of current or ex partner: Not on file    Emotionally abused: Not on file    Physically abused: Not on file    Forced sexual activity: Not on file  Other Topics Concern  . Not on file  Social History Narrative  . Not on file     Review of Systems  Musculoskeletal: Positive for  back pain.  All other systems reviewed and are negative.      Objective:   Physical Exam  Constitutional: She appears well-developed and well-nourished.  Neck: No JVD present.  Cardiovascular: Normal rate, regular rhythm and normal heart sounds.  No murmur heard. Pulmonary/Chest: Effort normal and breath sounds normal. No respiratory distress. She has no wheezes. She has no rales.  Musculoskeletal:     Left knee: She exhibits decreased range of motion, swelling, effusion and abnormal meniscus. She exhibits no LCL laxity and no MCL laxity. Tenderness found. Medial joint line and lateral joint line tenderness noted. No MCL and no LCL tenderness noted.  Vitals reviewed.         Assessment & Plan:  Left anterior knee pain - Plan: DG Knee Complete 4 Views Left  I believe the patient sprained her knee when she fell.  Obtain an x-ray to rule out a crack in the patella.  Patient has an effusion and also pain with flexion suggesting also a possible meniscal tear.  She is tried and failed ibuprofen.  Therefore using sterile technique, I injected the left knee with 2 cc lidocaine, 2 cc of Marcaine, and 2 cc of 40 mg/mL Kenalog.  The patient tolerated the procedure well without complication.  Proceed with x-ray of the knee

## 2018-10-30 ENCOUNTER — Other Ambulatory Visit: Payer: Self-pay

## 2018-10-30 ENCOUNTER — Ambulatory Visit (INDEPENDENT_AMBULATORY_CARE_PROVIDER_SITE_OTHER): Payer: Medicare Other | Admitting: Family Medicine

## 2018-10-30 DIAGNOSIS — G44201 Tension-type headache, unspecified, intractable: Secondary | ICD-10-CM | POA: Diagnosis not present

## 2018-10-30 MED ORDER — BUTALBITAL-APAP-CAFFEINE 50-325-40 MG PO TABS: 1.0000 | ORAL_TABLET | Freq: Four times a day (QID) | ORAL | 0 refills | Status: AC | PRN

## 2018-10-30 NOTE — Progress Notes (Signed)
Subjective:    Patient ID: [name], female    DOB: January 14, 1950, 68 y.o.   MRN: [id number]  HPI Patient is being seen today as a telephone visit.  She consents to be seen over the telephone.  Phone call began at 1155.  Phone call concluded at 1205.  Patient states that she has had a bad headache for approximately 1 week.  She states that she wakes up every morning with it.  The headache last off and on throughout the day.  She denies any falls.  She denies any head trauma.  She states that she had headaches like this off and on throughout her entire life.  She gets frequent headaches per her report.  However usually Excedrin migraine or Tylenol over-the-counter will help.  However over the last week the Excedrin and the Tylenol has not helped.  She describes it as a pressure-like feeling in the top of her head.  She reports the pain is of 4 or 5 on a scale of 10.  She denies any photophobia.  She does have some phonophobia.  She denies any nausea.  She denies any vomiting.  She denies any blurry vision.  She denies any neurologic deficit.  She denies any slurred speech or memory problems.  She denies any exacerbation of the pain with heavy lifting or straining or Valsalva.  She denies any neurologic deficit such as numbness or weakness of her upper or lower extremities. Past Medical History:  Diagnosis Date  . Chronic back pain   . Chronic pain in left shoulder   . Depression   . Dysphagia   . GERD (gastroesophageal reflux disease)   . Headache(784.0)   . Hypertension   . Vertigo    Past Surgical History:  Procedure Laterality Date  . COLONOSCOPY    . ESOPHAGEAL DILATION N/A 11/20/2014   Procedure: ESOPHAGEAL DILATION;  Surgeon: Corbin Ade, MD;  Location: [facility];  Service: Endoscopy;  Laterality: N/A;  . ESOPHAGOGASTRODUODENOSCOPY    . ESOPHAGOGASTRODUODENOSCOPY N/A 11/10/2014   NGE:[id number] schatzkis ringerosive reflux esophagitis  . ESOPHAGOGASTRODUODENOSCOPY N/A  11/20/2014   MWN:[id number] ring s/p dilation/HH  . TRACHEOSTOMY    . TUBAL LIGATION     Current Outpatient Medications on File Prior to Visit  Medication Sig Dispense Refill  . albuterol (VENTOLIN HFA) 108 (90 Base) MCG/ACT inhaler Inhale 2 puffs into the lungs every 6 (six) hours as needed for wheezing or shortness of breath. 1 Inhaler 0  . escitalopram (LEXAPRO) 10 MG tablet Take 1 tablet (10 mg total) by mouth daily. 30 tablet 1  . furosemide (LASIX) 40 MG tablet Take 1 tablet (40 mg total) by mouth daily as needed for fluid. 30 tablet 3  . meloxicam (MOBIC) 15 MG tablet Take 1 tablet (15 mg total) by mouth daily. 30 tablet 2  . metoprolol succinate (TOPROL-XL) 50 MG 24 hr tablet TAKE (1) TABLET BY MOUTH ONCE DAILY. TAKE WITH OR IMMEDIATELY FOLLOWING A MEAL. 90 tablet 2  . tiZANidine (ZANAFLEX) 4 MG tablet Take 1 tablet (4 mg total) by mouth every 6 (six) hours as needed for muscle spasms. 30 tablet 0   No current facility-administered medications on file prior to visit.    No Known Allergies Social History   Socioeconomic History  . Marital status: Married    Spouse name: Not on file  . Number of children: Not on file  . Years of education: Not on file  . Highest education level: Not on  file  Occupational History  . Not on file  Social Needs  . Financial resource strain: Not on file  . Food insecurity    Worry: Not on file    Inability: Not on file  . Transportation needs    Medical: Not on file    Non-medical: Not on file  Tobacco Use  . Smoking status: Never Smoker  . Smokeless tobacco: Never Used  Substance and Sexual Activity  . Alcohol use: No  . Drug use: No  . Sexual activity: Not on file  Lifestyle  . Physical activity    Days per week: Not on file    Minutes per session: Not on file  . Stress: Not on file  Relationships  . Social Musician on phone: Not on file    Gets together: Not on file    Attends religious service: Not on file    Active  member of club or organization: Not on file    Attends meetings of clubs or organizations: Not on file    Relationship status: Not on file  . Intimate partner violence    Fear of current or ex partner: Not on file    Emotionally abused: Not on file    Physically abused: Not on file    Forced sexual activity: Not on file  Other Topics Concern  . Not on file  Social History Narrative  . Not on file      Review of Systems  Constitutional: Negative for activity change, appetite change, chills, diaphoresis, fatigue, fever and unexpected weight change.  Neurological: Positive for light-headedness and headaches. Negative for dizziness, tremors, seizures, syncope, speech difficulty, weakness and numbness.       Objective:   Physical Exam  Physical exam cannot be performed as the patient is being seen as a telephone visit per her request.  Patient has no symptoms of any infection.  She has history of chronic headaches.  I believe this is most likely an atypical migraine or tension headache.  She is speaking full and complete sentences with no delirium or confusion.  She is not slurring her speech.  She denies any neurologic deficits on exam.      Assessment & Plan:  Acute intractable tension-type headache  Encounter is limited by this being a telephone encounter.  However her symptoms and history sound consistent with tension headaches versus atypical migraines.  I recommended trying Fioricet 1 tablet every 6 hours as needed for headache.  If the headache worsens or if she develops any neurologic deficit she is to go directly to the emergency room or seek medical attention immediately.  Recheck later this week if no better or sooner if worsening

## 2018-11-06 DIAGNOSIS — Z23 Encounter for immunization: Secondary | ICD-10-CM | POA: Diagnosis not present

## 2018-11-13 ENCOUNTER — Telehealth: Payer: Self-pay | Admitting: Family Medicine

## 2018-11-13 NOTE — Telephone Encounter (Signed)
Pt called for refill of Fioricet. Last refill was 10/31/2018. Per Dr. Dennard Schaumann if she is having to take that many we need to reevaluate. I called and spoke to pt and she states that she is having to take 2 a day every day. I informed her that she needed to be seen and she will try to find transportation and call us back for an apt.

## 2019-01-09 ENCOUNTER — Telehealth: Payer: Self-pay

## 2019-01-09 ENCOUNTER — Other Ambulatory Visit: Payer: Self-pay

## 2019-01-09 DIAGNOSIS — M7989 Other specified soft tissue disorders: Secondary | ICD-10-CM

## 2019-01-09 MED ORDER — FUROSEMIDE 40 MG PO TABS: 40.0000 mg | ORAL_TABLET | Freq: Every day | ORAL | 3 refills | Status: DC | PRN

## 2019-01-09 NOTE — Telephone Encounter (Signed)
Resume her lasix as you mentioned and I will be glad to see her.

## 2019-01-09 NOTE — Telephone Encounter (Signed)
Pt called to report edema in both feet and nowhere else. Noticed it on 01/09/2019. Pt states that she fell and twisted foot on 01/03/2019. Pt also states that she was out of lasix. I sent Rx to her pharmacy. I advised pt to come in an OV and pt explained she does not have a ride until Thurs 01/11/2019. Appt has been scheduled for then.

## 2019-01-11 ENCOUNTER — Ambulatory Visit: Payer: Medicare Other | Admitting: Family Medicine

## 2019-01-30 ENCOUNTER — Other Ambulatory Visit: Payer: Self-pay | Admitting: Family Medicine

## 2019-01-30 DIAGNOSIS — I10 Essential (primary) hypertension: Secondary | ICD-10-CM

## 2019-03-08 ENCOUNTER — Other Ambulatory Visit: Payer: Self-pay | Admitting: Family Medicine

## 2019-03-08 MED ORDER — ESCITALOPRAM OXALATE 10 MG PO TABS: 10.0000 mg | ORAL_TABLET | Freq: Every day | ORAL | 1 refills | Status: DC

## 2019-04-09 ENCOUNTER — Ambulatory Visit (INDEPENDENT_AMBULATORY_CARE_PROVIDER_SITE_OTHER): Payer: Managed Care, Other (non HMO) | Admitting: Family Medicine

## 2019-04-09 ENCOUNTER — Other Ambulatory Visit: Payer: Self-pay

## 2019-04-09 ENCOUNTER — Encounter: Payer: Self-pay | Admitting: Family Medicine

## 2019-04-09 VITALS — BP 132/80 | HR 82 | Temp 97.2°F | Resp 18 | Ht 61.5 in | Wt 190.0 lb

## 2019-04-09 DIAGNOSIS — S20219A Contusion of unspecified front wall of thorax, initial encounter: Secondary | ICD-10-CM

## 2019-04-09 DIAGNOSIS — F321 Major depressive disorder, single episode, moderate: Secondary | ICD-10-CM | POA: Diagnosis not present

## 2019-04-09 MED ORDER — ESCITALOPRAM OXALATE 20 MG PO TABS: 20.0000 mg | ORAL_TABLET | Freq: Every day | ORAL | 3 refills | Status: DC

## 2019-04-09 NOTE — Progress Notes (Signed)
Subjective:    Patient ID: [name], female    DOB: June 29, 1950, 69 y.o.   MRN: [id number]  Patient states that she is fallen twice recently within the last week.  1 time she fell in her yard and landed in the grass on her left side.  She landed so hard on her left arm that she bruised her left ribs.  She is tender to palpation under her left breast in the mid axillary line over the body of the ribs.  There is no bruising.  There is no skin discoloration.  There is no palpable swelling or warmth.  There is no palpable crepitus or deformity.  She is only minimally tender in that area.  She also fell/tripped walking into her house.  Her foot snag the door seal issue walked in while she was carrying groceries.  She fell on her right side and bruised her right ribs.  She is tender to palpation in the mid axillary line on the body of the 10th rib.  There is no swelling or bruising in that area either it is tender to palpation.  She denies any shortness of breath or pleurisy or hemoptysis or cough.  She does have numerous fleabites or insect bites on her abdomen.  There are at least 20 in a bandlike fashion on both sides around the umbilicus.  These are obviously itching as she has been scratching at them.  She also reports worsening depression.  She states that she is on Lexapro 10 mg a day.  She reports anhedonia.  She reports feeling sad.  She reports frequently feeling anxious.  She states she feels nervous all the time.  She denies any suicidal ideation.  She wants to increase the Lexapro to 20 mg a day.  Past Medical History:  Diagnosis Date  . Chronic back pain   . Chronic pain in left shoulder   . Depression   . Dysphagia   . GERD (gastroesophageal reflux disease)   . Headache(784.0)   . Hypertension   . Vertigo    Past Surgical History:  Procedure Laterality Date  . COLONOSCOPY    . ESOPHAGEAL DILATION N/A 11/20/2014   Procedure: ESOPHAGEAL DILATION;  Surgeon: Corbin Ade, MD;   Location: [facility];  Service: Endoscopy;  Laterality: N/A;  . ESOPHAGOGASTRODUODENOSCOPY    . ESOPHAGOGASTRODUODENOSCOPY N/A 11/10/2014   MVH:[id number] schatzkis ringerosive reflux esophagitis  . ESOPHAGOGASTRODUODENOSCOPY N/A 11/20/2014   WUX:[id number] ring s/p dilation/HH  . TRACHEOSTOMY    . TUBAL LIGATION     Current Outpatient Medications on File Prior to Visit  Medication Sig Dispense Refill  . albuterol (VENTOLIN HFA) 108 (90 Base) MCG/ACT inhaler Inhale 2 puffs into the lungs every 6 (six) hours as needed for wheezing or shortness of breath. 1 Inhaler 0  . butalbital-acetaminophen-caffeine (FIORICET) 50-325-40 MG tablet Take 1-2 tablets by mouth every 6 (six) hours as needed for headache. 20 tablet 0  . furosemide (LASIX) 40 MG tablet Take 1 tablet (40 mg total) by mouth daily as needed for fluid. 30 tablet 3  . metoprolol succinate (TOPROL-XL) 50 MG 24 hr tablet TAKE (1) TABLET BY MOUTH ONCE DAILY. TAKE WITH OR IMMEDIATELY FOLLOWING A MEAL. 30 tablet 0  . tiZANidine (ZANAFLEX) 4 MG tablet Take 1 tablet (4 mg total) by mouth every 6 (six) hours as needed for muscle spasms. 30 tablet 0   No current facility-administered medications on file prior to visit.   No Known Allergies Social History  Socioeconomic History  . Marital status: Married    Spouse name: Not on file  . Number of children: Not on file  . Years of education: Not on file  . Highest education level: Not on file  Occupational History  . Not on file  Tobacco Use  . Smoking status: Never Smoker  . Smokeless tobacco: Never Used  Substance and Sexual Activity  . Alcohol use: No  . Drug use: No  . Sexual activity: Not on file  Other Topics Concern  . Not on file  Social History Narrative  . Not on file   Social Determinants of Health   Financial Resource Strain:   . Difficulty of Paying Living Expenses:   Food Insecurity:   . Worried About Programme researcher, broadcasting/film/video in the Last Year:   . Barista  in the Last Year:   Transportation Needs:   . Freight forwarder (Medical):   Marland Kitchen Lack of Transportation (Non-Medical):   Physical Activity:   . Days of Exercise per Week:   . Minutes of Exercise per Session:   Stress:   . Feeling of Stress :   Social Connections:   . Frequency of Communication with Friends and Family:   . Frequency of Social Gatherings with Friends and Family:   . Attends Religious Services:   . Active Member of Clubs or Organizations:   . Attends Banker Meetings:   Marland Kitchen Marital Status:   Intimate Partner Violence:   . Fear of Current or Ex-Partner:   . Emotionally Abused:   Marland Kitchen Physically Abused:   . Sexually Abused:      Review of Systems  Musculoskeletal: Positive for back pain.  All other systems reviewed and are negative.      Objective:   Physical Exam  Constitutional: She appears well-developed and well-nourished.  Neck: No JVD present.  Cardiovascular: Normal rate, regular rhythm and normal heart sounds.  No murmur heard. Pulmonary/Chest: Effort normal and breath sounds normal. No respiratory distress. She has no wheezes. She has no rales. She exhibits tenderness.    Abdominal: Soft. Bowel sounds are normal. She exhibits no distension. There is no abdominal tenderness. There is no rebound.  Musculoskeletal:        General: No edema.  Vitals reviewed.         Assessment & Plan:  Contusion of rib, unspecified laterality, initial encounter  Current moderate episode of major depressive disorder without prior episode (HCC)  Increase Lexapro to 20 mg a day and reassess in 1 month.  I believe the patient bruised her ribs when she fell.  There is no evidence of fracture or any underlying pulmonary injury.  Therefore I recommended tincture of time.  Pain should gradually improve over the next 2 to 3 weeks.  Recheck immediately if symptoms change or worsen.

## 2019-04-24 ENCOUNTER — Other Ambulatory Visit: Payer: Self-pay | Admitting: Family Medicine

## 2019-04-24 DIAGNOSIS — M7989 Other specified soft tissue disorders: Secondary | ICD-10-CM

## 2019-04-24 MED ORDER — FUROSEMIDE 40 MG PO TABS: 40.0000 mg | ORAL_TABLET | Freq: Every day | ORAL | 3 refills | Status: DC | PRN

## 2019-05-22 ENCOUNTER — Other Ambulatory Visit: Payer: Self-pay | Admitting: Family Medicine

## 2019-05-22 DIAGNOSIS — I10 Essential (primary) hypertension: Secondary | ICD-10-CM

## 2019-07-27 ENCOUNTER — Ambulatory Visit (INDEPENDENT_AMBULATORY_CARE_PROVIDER_SITE_OTHER): Payer: Managed Care, Other (non HMO) | Admitting: Family Medicine

## 2019-07-27 ENCOUNTER — Other Ambulatory Visit: Payer: Self-pay

## 2019-07-27 VITALS — BP 142/78 | HR 79 | Temp 96.1°F | Ht 61.0 in | Wt 198.0 lb

## 2019-07-27 DIAGNOSIS — R5382 Chronic fatigue, unspecified: Secondary | ICD-10-CM | POA: Diagnosis not present

## 2019-07-27 NOTE — Progress Notes (Signed)
Subjective:    Patient ID: [name], female    DOB: February 20, 1950, 69 y.o.   MRN: [id number]  Back Pain   02/03/17 Patient presents with 3 concerns first she has pain in her third toe on her right foot. Distal to the PIP joint is a warty hard-corn-like papule that appears to be secondary to stricturing coming from the fourth toe adjacent to it. The fourth toe hammertoe with a medial deviation of the toe distal to the PIP joint. The tip and toenail of the fourth toe rub the third toe in the area where the corn is located.  Second concern is low back pain. She states this is been present ever since the 1970s when she was not down at the [facility] and landed striking her lower back against the step. She points to a bandlike pain in her lower back around the level of L4 and L5. There is no radiation of the pain. There is no numbness or tingling in her legs or weakness in her legs. However she states that she has severe pain on a daily basis that limits her quality of life. She is tried Aleve with no benefit. Third concern is her elevated blood pressure which she was unaware of. She denies any chest pain shortness of breath or dyspnea on exertion.  At that time, my plan was  I suspect that the patient has chronic underlying muscle pain in her low back likely due to degenerative disc disease. Begin Mobic 15 mg a day to help manage low back pain.  Recommended physical therapy however I will start with an x-ray of the lumbar spine to evaluate further. Begin lisinopril 20 mg a day in addition to Toprol.  Recheck blood pressure in one month. Consult podiatry regarding the hammertoe and adjacent corn.  03/25/17  X-ray revealed minimal degenerative changes.  See report below: FINDINGS: 6 non-rib-bearing lumbar vertebra.  Bones appear demineralized.  Vertebral body heights maintained without fracture or subluxation.  Disc space heights fairly well maintained.  No  spondylolysis.  Facet degenerative changes lower lumbar spine.  SI joints and hip joints preserved.  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  No acute abnormalities.  Patient is here today stating you got to give me some stronger for pain.  I asked if the patient has gone to physical therapy.  She states that she cannot go.  She has no transportation.  She states that her husband will not drive her to physical therapy.  I told the patient I find that hard to believe.  I asked if she had asked him.  She states that he does not care about her.  I explained to the patient that simply giving pain medication is not a good long-term plan to manage chronic muscular pain.  I again recommended physical therapy.  Patient states I won't go.  I told the patient that I would not prescribe pain medication unless we were working on a comprehensive plan to manage her back pain rather than just mask it.  At that point the patient became extremely upset stood up and walked out of the office standing, "I won't pay."  At that time, my plan was: This encounter was unlike anything I have ever had with this patient.  Previously we have had a very good relationship.  She seemed very upset that I would not prescribe her pain medication but she would not listen to my rationale and my plan for  how to get her back pain better.  Furthermore her behavior at the office visit was unacceptable.  Simply standing up and storming out of the office without giving me a chance to formulate a plan does not constitute a working doctor patient relationship.  She will not be charged for this encounter.  However I do not see how we can continue as a doctor in a patient if she will not listen to or follow my recommendations.  I will not formally dismiss the patient at the current time.  If the patient comes back for a follow-up visit, I will discuss what happened today further and try to  establish some ground rules for moving ahead.    05/01/18 Patient continues to complain of pain in her lower back.  Pain is made worse when she stands for prolonged period of time such as standing at the sink to wash dishes.  The pain stays in a bandlike fashion in her lower back around the level of L4-L5.  She denies any sciatica.  She denies any leg weakness or numbness in the legs.  She denies any bowel or bladder incontinence.  Previous x-ray showed only minimal degenerative changes.  She denies any symptoms of lumbar radiculopathy.  She denies any fevers or chills or weight loss.  She has been taking meloxicam every day however and has now developed indigestion.  She denies any melena or hematochezia.  She would also like an inhaler.  She wheezes whenever she tries to do any physical activity.  She also wheezes and coughs when she walks to the mailbox.  She would like an inhaler that she could use as needed for the wheezing.  At that time, my plan was: Recommend she discontinue meloxicam as I believe this is likely causing her gastrointestinal upset.  Would recommend trying Flexeril 10 mg every 8 hours as needed for muscle spasms and muscle pain.  Continue to recommend physical therapy which she declines.  Recommend that she wear a back brace every day and also recommended that she use antifatigue mats at the sink in other places where she stands to help reduce the fatigue and muscle strain in her lower back.  We will give her albuterol to use as needed for wheezing.  If this does not help, I would recommend an evaluation for possible cardiac causes of shortness of breath and wheezing  07/27/19 Patient presents today complaining of fatigue.  She denies any chest pain.  She denies any shortness of breath.  She denies any dyspnea on exertion.  She denies any cough or hemoptysis.  She denies any fevers or chills.  She denies any night sweats.  She denies any weight loss.  She denies any melena or  hematochezia.  She attributes the fatigue mainly to the severe pain in her lower back.  She uses the example of washing dishes yesterday.  She states that she had to sit down 3 times just finished washing the dishes due to the pain in her back.  She states that her back hurts so bad at times that she will cry.  I asked if she tried any of her muscle relaxers yesterday when the pain was bad to see if they would help.  She states that she did not.  She states that the muscle relaxers never provided her any relief from the pain.  She primarily made the appointment today however due to her fatigue.  She denies any recent illnesses.  She denies depression although she  seems tearful and sad during our discussion. Past Medical History:  Diagnosis Date  . Chronic back pain   . Chronic pain in left shoulder   . Depression   . Dysphagia   . GERD (gastroesophageal reflux disease)   . Headache(784.0)   . Hypertension   . Vertigo    Past Surgical History:  Procedure Laterality Date  . COLONOSCOPY    . ESOPHAGEAL DILATION N/A 11/20/2014   Procedure: ESOPHAGEAL DILATION;  Surgeon: Corbin Ade, MD;  Location: [facility];  Service: Endoscopy;  Laterality: N/A;  . ESOPHAGOGASTRODUODENOSCOPY    . ESOPHAGOGASTRODUODENOSCOPY N/A 11/10/2014   [id number] schatzkis ringerosive reflux esophagitis  . ESOPHAGOGASTRODUODENOSCOPY N/A 11/20/2014   [id number] ring s/p dilation/HH  . TRACHEOSTOMY    . TUBAL LIGATION     Current Outpatient Medications on File Prior to Visit  Medication Sig Dispense Refill  . albuterol (VENTOLIN HFA) 108 (90 Base) MCG/ACT inhaler Inhale 2 puffs into the lungs every 6 (six) hours as needed for wheezing or shortness of breath. 1 Inhaler 0  . butalbital-acetaminophen-caffeine (FIORICET) 50-325-40 MG tablet Take 1-2 tablets by mouth every 6 (six) hours as needed for headache. 20 tablet 0  . escitalopram (LEXAPRO) 20 MG tablet Take 1 tablet (20 mg total) by mouth daily. 90 tablet 3   . furosemide (LASIX) 40 MG tablet Take 1 tablet (40 mg total) by mouth daily as needed for fluid. 90 tablet 3  . metoprolol succinate (TOPROL-XL) 50 MG 24 hr tablet TAKE (1) TABLET BY MOUTH ONCE DAILY. TAKE WITH OR IMMEDIATELY FOLLOWING A MEAL. 90 tablet 2  . tiZANidine (ZANAFLEX) 4 MG tablet Take 1 tablet (4 mg total) by mouth every 6 (six) hours as needed for muscle spasms. 30 tablet 0   No current facility-administered medications on file prior to visit.   No Known Allergies Social History   Socioeconomic History  . Marital status: Married    Spouse name: Not on file  . Number of children: Not on file  . Years of education: Not on file  . Highest education level: Not on file  Occupational History  . Not on file  Tobacco Use  . Smoking status: Never Smoker  . Smokeless tobacco: Never Used  Substance and Sexual Activity  . Alcohol use: No  . Drug use: No  . Sexual activity: Not on file  Other Topics Concern  . Not on file  Social History Narrative  . Not on file   Social Determinants of Health   Financial Resource Strain:   . Difficulty of Paying Living Expenses:   Food Insecurity:   . Worried About Programme researcher, broadcasting/film/video in the Last Year:   . Barista in the Last Year:   Transportation Needs:   . Freight forwarder (Medical):   Marland Kitchen Lack of Transportation (Non-Medical):   Physical Activity:   . Days of Exercise per Week:   . Minutes of Exercise per Session:   Stress:   . Feeling of Stress :   Social Connections:   . Frequency of Communication with Friends and Family:   . Frequency of Social Gatherings with Friends and Family:   . Attends Religious Services:   . Active Member of Clubs or Organizations:   . Attends Banker Meetings:   Marland Kitchen Marital Status:   Intimate Partner Violence:   . Fear of Current or Ex-Partner:   . Emotionally Abused:   Marland Kitchen Physically Abused:   . Sexually Abused:  Review of Systems  Musculoskeletal: Positive for  back pain.  All other systems reviewed and are negative.      Objective:   Physical Exam Vitals reviewed.  Constitutional:      General: She is not in acute distress.    Appearance: She is well-developed. She is obese. She is not ill-appearing or toxic-appearing.  Neck:     Vascular: No carotid bruit.  Cardiovascular:     Rate and Rhythm: Normal rate and regular rhythm.     Heart sounds: Normal heart sounds. No murmur heard.  No friction rub. No gallop.   Pulmonary:     Effort: Pulmonary effort is normal. No respiratory distress.     Breath sounds: Normal breath sounds. No stridor. No wheezing, rhonchi or rales.  Chest:     Chest wall: No tenderness.  Abdominal:     General: Bowel sounds are normal.     Palpations: Abdomen is soft.     Tenderness: There is no abdominal tenderness. There is no guarding or rebound.  Musculoskeletal:     Lumbar back: Tenderness and bony tenderness present. Decreased range of motion.     Right lower leg: No edema.     Left lower leg: No edema.  Lymphadenopathy:     Cervical: No cervical adenopathy.  Neurological:     General: No focal deficit present.     Mental Status: She is alert and oriented to person, place, and time.     Cranial Nerves: No cranial nerve deficit.     Motor: No weakness.     Coordination: Coordination normal.           Assessment & Plan:  Chronic fatigue - Plan: CBC with Differential/Platelet, COMPLETE METABOLIC PANEL WITH GFR, Vitamin B12, TSH, Sedimentation rate  Patient's physical exam does not provide any clues as to the source of her fatigue.  Her review of systems is also completely negative aside from the severe pain that she reports in her lower back.  I believe her fatigue may be felt multifactorial and related to underlying depression coupled with chronic low back pain.  However I would begin by obtaining basic lab work including a CBC to evaluate for anemia or any bone marrow abnormalities, CMP to check for  any liver, kidney, or electrolyte abnormalities, a B12 to evaluate for B12 deficiency, a TSH to evaluate for hypothyroidism, and a sedimentation rate to evaluate for any possible autoimmune disease.  If lab work is normal, I would focus on trying to better manage her depression and also treat her low back pain.  We discussed possibly using tramadol on a daily basis to help calm her pain.  I believe that this would help her improve her quality of life which would lessen her anhedonia and hopefully also help treat her depression by making her feel more useful and more capable of doing her household tasks.

## 2019-07-28 LAB — CBC WITH DIFFERENTIAL/PLATELET
Absolute Monocytes: 881 cells/uL (ref 200–950)
Basophils Absolute: 67 cells/uL (ref 0–200)
Basophils Relative: 0.9 %
Eosinophils Absolute: 311 cells/uL (ref 15–500)
Eosinophils Relative: 4.2 %
HCT: 43.2 % (ref 35.0–45.0)
Hemoglobin: 14 g/dL (ref 11.7–15.5)
Lymphs Abs: 2116 cells/uL (ref 850–3900)
MCH: 28.4 pg (ref 27.0–33.0)
MCHC: 32.4 g/dL (ref 32.0–36.0)
MCV: 87.6 fL (ref 80.0–100.0)
MPV: 11.2 fL (ref 7.5–12.5)
Monocytes Relative: 11.9 %
Neutro Abs: 4026 cells/uL (ref 1500–7800)
Neutrophils Relative %: 54.4 %
Platelets: 286 10*3/uL (ref 140–400)
RBC: 4.93 10*6/uL (ref 3.80–5.10)
RDW: 13.2 % (ref 11.0–15.0)
Total Lymphocyte: 28.6 %
WBC: 7.4 10*3/uL (ref 3.8–10.8)

## 2019-07-28 LAB — COMPLETE METABOLIC PANEL WITH GFR
AG Ratio: 1.4 (calc) (ref 1.0–2.5)
ALT: 12 U/L (ref 6–29)
AST: 15 U/L (ref 10–35)
Albumin: 4 g/dL (ref 3.6–5.1)
Alkaline phosphatase (APISO): 73 U/L (ref 37–153)
BUN: 17 mg/dL (ref 7–25)
CO2: 24 mmol/L (ref 20–32)
Calcium: 9.4 mg/dL (ref 8.6–10.4)
Chloride: 102 mmol/L (ref 98–110)
Creat: 0.86 mg/dL (ref 0.60–0.93)
GFR, Est African American: 79 mL/min/{1.73_m2} (ref >=60)
GFR, Est Non African American: 68 mL/min/{1.73_m2} (ref >=60)
Globulin: 2.9 g/dL (calc) (ref 1.9–3.7)
Glucose, Bld: 82 mg/dL (ref 65–99)
Potassium: 4.7 mmol/L (ref 3.5–5.3)
Sodium: 138 mmol/L (ref 135–146)
Total Bilirubin: 0.3 mg/dL (ref 0.2–1.2)
Total Protein: 6.9 g/dL (ref 6.1–8.1)

## 2019-07-28 LAB — TSH: TSH: 0.63 mIU/L (ref 0.40–4.50)

## 2019-07-28 LAB — SEDIMENTATION RATE: Sed Rate: 6 mm/h (ref 0–30)

## 2019-07-28 LAB — VITAMIN B12: Vitamin B-12: 306 pg/mL (ref 200–1100)

## 2019-07-30 ENCOUNTER — Other Ambulatory Visit: Payer: Self-pay | Admitting: Family Medicine

## 2019-07-30 MED ORDER — TRAMADOL HCL 50 MG PO TABS: 50.0000 mg | ORAL_TABLET | Freq: Three times a day (TID) | ORAL | 0 refills | Status: AC | PRN

## 2019-08-09 ENCOUNTER — Other Ambulatory Visit: Payer: Self-pay

## 2019-10-15 ENCOUNTER — Other Ambulatory Visit: Payer: Self-pay | Admitting: Family Medicine

## 2019-11-19 ENCOUNTER — Telehealth: Payer: Self-pay | Admitting: *Deleted

## 2019-11-19 NOTE — Telephone Encounter (Signed)
Received call from [facility], NP with [facility].   Reports that she is with patient at this time and requested to schedule appointment with PCP. Reports that patient is noted to have suicidal ideation with no active plan.   Advised that patient will need to be seen by [facility] gave information for [facility].   NP also reported slightly elevated BP of 136/90. Advised that BH crisis should be handled at this time and BP could be assessed after crisis has passed.   Verbalized understanding.

## 2020-05-13 ENCOUNTER — Ambulatory Visit: Payer: Managed Care, Other (non HMO) | Admitting: Family Medicine

## 2020-05-19 ENCOUNTER — Other Ambulatory Visit: Payer: Self-pay

## 2020-05-19 ENCOUNTER — Encounter: Payer: Self-pay | Admitting: Nurse Practitioner

## 2020-05-19 ENCOUNTER — Ambulatory Visit (INDEPENDENT_AMBULATORY_CARE_PROVIDER_SITE_OTHER): Payer: Self-pay | Admitting: Nurse Practitioner

## 2020-05-19 VITALS — HR 67 | Temp 97.5°F | Ht 61.0 in | Wt 192.0 lb

## 2020-05-19 DIAGNOSIS — M25562 Pain in left knee: Secondary | ICD-10-CM

## 2020-05-19 MED ORDER — DICLOFENAC SODIUM 1 % EX GEL: 4.0000 g | Freq: Four times a day (QID) | CUTANEOUS | 0 refills | Status: DC
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
## 2020-06-23 ENCOUNTER — Telehealth: Payer: Self-pay | Admitting: *Deleted
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
## 2020-07-02 ENCOUNTER — Other Ambulatory Visit: Payer: Self-pay | Admitting: Family Medicine

## 2020-07-02 DIAGNOSIS — I10 Essential (primary) hypertension: Secondary | ICD-10-CM

## 2020-07-04 ENCOUNTER — Other Ambulatory Visit: Payer: Self-pay

## 2020-07-04 ENCOUNTER — Ambulatory Visit (INDEPENDENT_AMBULATORY_CARE_PROVIDER_SITE_OTHER): Payer: Self-pay | Admitting: Family Medicine

## 2020-07-04 VITALS — BP 150/78 | HR 70 | Temp 97.9°F | Resp 14 | Ht 61.0 in | Wt 201.0 lb

## 2020-07-04 DIAGNOSIS — M7989 Other specified soft tissue disorders: Secondary | ICD-10-CM

## 2020-07-04 DIAGNOSIS — M79602 Pain in left arm: Secondary | ICD-10-CM

## 2020-07-04 MED ORDER — FUROSEMIDE 40 MG PO TABS: 40.0000 mg | ORAL_TABLET | Freq: Every day | ORAL | 3 refills | Status: DC | PRN

## 2020-07-04 NOTE — Progress Notes (Signed)
Subjective:    Patient ID: [name], female    DOB: [birth year], 70 y.o.   MRN: [id number]   Over the last 2 weeks, the patient is having left arm pain.  The first event occurred while she was preparing food in her kitchen.  She went to dump a container containing water.  When she tried to dump the container out, her third and fourth fingers became locked in flexion at the mcp joints.  She felt burning stinging electrical pain radiating up and down her left arm from her third and fourth fingers all the way into her left shoulder.  It was shooting searing pain.  It resolved spontaneously.  That was the worst occasion.  Since that time, she has had intermittent pain radiating from her left shoulder all the way down her left arm into her left hand.  The pain is electrical and burning at times.  It aches and throbs.  She denies any chest pain shortness of breath or dyspnea on exertion.  She denies any numbness and tingling in her face.  She denies any weakness or neurologic deficits anywhere else on the body. Past Medical History:  Diagnosis Date   Chronic back pain    Chronic pain in left shoulder    Depression    Dysphagia    GERD (gastroesophageal reflux disease)    Headache(784.0)    Hypertension    Vertigo    Past Surgical History:  Procedure Laterality Date   COLONOSCOPY     ESOPHAGEAL DILATION N/A 11/20/2014   Procedure: ESOPHAGEAL DILATION;  Surgeon: Corbin Ade, MD;  Location: [facility];  Service: Endoscopy;  Laterality: N/A;   ESOPHAGOGASTRODUODENOSCOPY     ESOPHAGOGASTRODUODENOSCOPY N/A 11/10/2014   NFA:[id number] schatzkis ringerosive reflux esophagitis   ESOPHAGOGASTRODUODENOSCOPY N/A 11/20/2014   ION:GEXBMWUX'L ring s/p dilation/HH   TRACHEOSTOMY     TUBAL LIGATION     Current Outpatient Medications on File Prior to Visit  Medication Sig Dispense Refill   albuterol (VENTOLIN HFA) 108 (90 Base) MCG/ACT inhaler Inhale 2 puffs into the lungs every 6 (six) hours as  needed for wheezing or shortness of breath. 1 Inhaler 0   diclofenac Sodium (VOLTAREN) 1 % GEL Apply 4 g topically 4 (four) times daily. 100 g 0   escitalopram (LEXAPRO) 20 MG tablet Take 1 tablet (20 mg total) by mouth daily. 90 tablet 3   metoprolol succinate (TOPROL-XL) 50 MG 24 hr tablet TAKE (1) TABLET BY MOUTH ONCE DAILY. TAKE WITH OR IMMEDIATELY FOLLOWING A MEAL. 30 tablet 0   No current facility-administered medications on file prior to visit.   No Known Allergies Social History   Socioeconomic History   Marital status: Married    Spouse name: Not on file   Number of children: Not on file   Years of education: Not on file   Highest education level: Not on file  Occupational History   Not on file  Tobacco Use   Smoking status: Never   Smokeless tobacco: Never  Substance and Sexual Activity   Alcohol use: No   Drug use: No   Sexual activity: Not on file  Other Topics Concern   Not on file  Social History Narrative   Not on file   Social Determinants of Health   Financial Resource Strain: Not on file  Food Insecurity: Not on file  Transportation Needs: Not on file  Physical Activity: Not on file  Stress: Not on file  Social Connections: Not on  file  Intimate Partner Violence: Not on file     Review of Systems  Gastrointestinal:  Positive for constipation.  Musculoskeletal:  Positive for back pain.  All other systems reviewed and are negative.     Objective:   Physical Exam Vitals reviewed.  Constitutional:      General: She is not in acute distress.    Appearance: She is well-developed. She is obese. She is not ill-appearing or toxic-appearing.  Neck:     Vascular: No carotid bruit.  Cardiovascular:     Rate and Rhythm: Normal rate and regular rhythm.     Heart sounds: Normal heart sounds. No murmur heard.   No friction rub. No gallop.  Pulmonary:     Effort: Pulmonary effort is normal. No respiratory distress.     Breath sounds: Normal breath  sounds. No stridor. No wheezing, rhonchi or rales.  Chest:     Chest wall: No tenderness.  Abdominal:     General: Bowel sounds are normal.     Palpations: Abdomen is soft.     Tenderness: There is no abdominal tenderness. There is no guarding or rebound.  Musculoskeletal:     Right shoulder: Normal.     Left shoulder: Normal.     Right wrist: Normal. No bony tenderness.     Left wrist: Normal. No bony tenderness.     Right hand: No bony tenderness. Normal range of motion. Normal strength. Normal sensation.     Left hand: No bony tenderness. Normal range of motion. Normal strength. Normal sensation.     Right lower leg: No edema.     Left lower leg: No edema.  Lymphadenopathy:     Cervical: No cervical adenopathy.  Neurological:     General: No focal deficit present.     Mental Status: She is alert and oriented to person, place, and time.     Cranial Nerves: No cranial nerve deficit.     Motor: No weakness.     Coordination: Coordination normal.   Patient does have a positive Tinel's sign but a negative Phalen sign.       Assessment & Plan:  Left arm pain - Plan: NCV with EMG(electromyography)  Leg swelling - Plan: furosemide (LASIX) 40 MG tablet Patient ran out of her Lasix and that is why she feels that her legs are swelling so I will refill her Lasix.  There is no significant swelling seen today on exam.  I believe the pain in her left arm is likely neuropathic pain.  She had an MRI of the cervical spine in 2017 that showed no significant nerve impingement although she did have some mild spinal stenosis at that time.  I will start with a nerve conduction study of the left arm.  If it shows evidence of cervical radiculopathy, we will proceed to an MRI of the cervical spine again.  However if it shows carpal tunnel syndrome, we will consult a hand specialist.  The numbness and tingling that she felt primarily in her hand was in the third and fourth fingers.  However at that time it  sounded like she also was experiencing a trigger finger as well

## 2020-07-18 ENCOUNTER — Other Ambulatory Visit: Payer: Self-pay

## 2020-07-18 ENCOUNTER — Ambulatory Visit (INDEPENDENT_AMBULATORY_CARE_PROVIDER_SITE_OTHER): Payer: Medicare Other | Admitting: Nurse Practitioner

## 2020-07-18 VITALS — BP 132/72 | HR 74 | Temp 98.6°F | Ht 61.0 in | Wt 201.2 lb

## 2020-07-18 DIAGNOSIS — Z1231 Encounter for screening mammogram for malignant neoplasm of breast: Secondary | ICD-10-CM

## 2020-07-18 DIAGNOSIS — Z780 Asymptomatic menopausal state: Secondary | ICD-10-CM

## 2020-07-18 DIAGNOSIS — R600 Localized edema: Secondary | ICD-10-CM

## 2020-07-18 DIAGNOSIS — Z136 Encounter for screening for cardiovascular disorders: Secondary | ICD-10-CM

## 2020-07-18 DIAGNOSIS — Z1211 Encounter for screening for malignant neoplasm of colon: Secondary | ICD-10-CM

## 2020-07-18 DIAGNOSIS — Z13220 Encounter for screening for lipoid disorders: Secondary | ICD-10-CM

## 2020-07-18 DIAGNOSIS — N959 Unspecified menopausal and perimenopausal disorder: Secondary | ICD-10-CM

## 2020-07-18 DIAGNOSIS — Z13820 Encounter for screening for osteoporosis: Secondary | ICD-10-CM
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
## 2020-07-23 ENCOUNTER — Encounter: Payer: Self-pay | Admitting: Internal Medicine

## 2020-08-25 ENCOUNTER — Other Ambulatory Visit: Payer: Self-pay | Admitting: Family Medicine

## 2020-08-25 DIAGNOSIS — I10 Essential (primary) hypertension: Secondary | ICD-10-CM

## 2020-09-19 ENCOUNTER — Encounter: Payer: Self-pay | Admitting: Family Medicine

## 2020-09-19 ENCOUNTER — Other Ambulatory Visit: Payer: Self-pay

## 2020-09-19 ENCOUNTER — Ambulatory Visit (INDEPENDENT_AMBULATORY_CARE_PROVIDER_SITE_OTHER): Payer: Self-pay | Admitting: Family Medicine

## 2020-09-19 VITALS — BP 134/78 | HR 84 | Temp 98.1°F | Resp 16 | Ht 61.0 in | Wt 196.0 lb

## 2020-09-19 DIAGNOSIS — K219 Gastro-esophageal reflux disease without esophagitis: Secondary | ICD-10-CM

## 2020-09-19 MED ORDER — PANTOPRAZOLE SODIUM 40 MG PO TBEC: 40.0000 mg | DELAYED_RELEASE_TABLET | Freq: Every day | ORAL | 3 refills | Status: DC

## 2020-09-19 NOTE — Progress Notes (Signed)
Subjective:    Patient ID: [name], female    DOB: 10-22-1950, 70 y.o.   MRN: [id number]  States that he has a history of a hiatal hernia.  3 weeks ago, she fell and struck her chest against a chair when she fell and lost her balance.  She struck around xiphoid process of the sternum.  Since that time she has had burning pain radiating up from her stomach into her chest.  She denies any hematemesis.  She denies any dysphagia.  She denies any melena or hematochezia.  She denies any pleurisy.  She denies any shortness of breath.  She denies any hemoptysis.  She denies any cough.  She denies any chest pain.  There is a burning epigastric pain left upper quadrant pain that radiates up in the center of the chest wall just below the sternum.  She states is worse with food.  Denies any pain when laying down Past Medical History:  Diagnosis Date   Chronic back pain    Chronic pain in left shoulder    Depression    Dysphagia    GERD (gastroesophageal reflux disease)    Headache(784.0)    Hypertension    Vertigo    Past Surgical History:  Procedure Laterality Date   COLONOSCOPY     ESOPHAGEAL DILATION N/A 11/20/2014   Procedure: ESOPHAGEAL DILATION;  Surgeon: Corbin Ade, MD;  Location: [facility];  Service: Endoscopy;  Laterality: N/A;   ESOPHAGOGASTRODUODENOSCOPY     ESOPHAGOGASTRODUODENOSCOPY N/A 11/10/2014   MVH:QIONGEXBM schatzkis ringerosive reflux esophagitis   ESOPHAGOGASTRODUODENOSCOPY N/A 11/20/2014   WUX:[id number]'V ring s/p dilation/HH   TRACHEOSTOMY     TUBAL LIGATION     Current Outpatient Medications on File Prior to Visit  Medication Sig Dispense Refill   albuterol (VENTOLIN HFA) 108 (90 Base) MCG/ACT inhaler Inhale 2 puffs into the lungs every 6 (six) hours as needed for wheezing or shortness of breath. 1 Inhaler 0   calcium carbonate (TUMS - DOSED IN MG ELEMENTAL CALCIUM) 500 MG chewable tablet Chew 1 tablet by mouth daily.     diclofenac Sodium (VOLTAREN) 1 %  GEL Apply 4 g topically 4 (four) times daily. 100 g 0   escitalopram (LEXAPRO) 20 MG tablet Take 1 tablet (20 mg total) by mouth daily. 90 tablet 3   metoprolol succinate (TOPROL-XL) 50 MG 24 hr tablet TAKE (1) TABLET BY MOUTH ONCE DAILY. TAKE WITH OR IMMEDIATELY FOLLOWING A MEAL. 30 tablet 0   No current facility-administered medications on file prior to visit.   No Known Allergies Social History   Socioeconomic History   Marital status: Married    Spouse name: Not on file   Number of children: Not on file   Years of education: Not on file   Highest education level: Not on file  Occupational History   Not on file  Tobacco Use   Smoking status: Never   Smokeless tobacco: Never  Substance and Sexual Activity   Alcohol use: No   Drug use: No   Sexual activity: Not on file  Other Topics Concern   Not on file  Social History Narrative   Not on file   Social Determinants of Health   Financial Resource Strain: Not on file  Food Insecurity: Not on file  Transportation Needs: Not on file  Physical Activity: Not on file  Stress: Not on file  Social Connections: Not on file  Intimate Partner Violence: Not on file  Review of Systems  Musculoskeletal:  Positive for back pain.  All other systems reviewed and are negative.     Objective:   Physical Exam Vitals reviewed.  Constitutional:      General: She is not in acute distress.    Appearance: She is well-developed. She is obese. She is not ill-appearing or toxic-appearing.  HENT:     Mouth/Throat:     Mouth: Mucous membranes are moist.     Pharynx: No oropharyngeal exudate or posterior oropharyngeal erythema.  Neck:     Vascular: No carotid bruit.  Cardiovascular:     Rate and Rhythm: Normal rate and regular rhythm.     Pulses: Normal pulses.     Heart sounds: Normal heart sounds. No murmur heard.   No friction rub. No gallop.  Pulmonary:     Effort: Pulmonary effort is normal. No respiratory distress.      Breath sounds: Normal breath sounds. No stridor. No wheezing, rhonchi or rales.  Chest:     Chest wall: No deformity, swelling, tenderness, crepitus or edema.    Abdominal:     General: Bowel sounds are normal. There is no distension.     Palpations: Abdomen is soft. There is no mass.     Tenderness: There is no abdominal tenderness. There is no guarding or rebound.     Hernia: No hernia is present.  Musculoskeletal:     Cervical back: Neck supple.     Lumbar back: Tenderness and bony tenderness present. Decreased range of motion.     Right lower leg: No edema.     Left lower leg: No edema.  Lymphadenopathy:     Cervical: No cervical adenopathy.  Neurological:     General: No focal deficit present.     Mental Status: She is alert and oriented to person, place, and time.     Cranial Nerves: No cranial nerve deficit.     Motor: No weakness.     Coordination: Coordination normal.          Assessment & Plan:  Gastroesophageal reflux disease, unspecified whether esophagitis present I am not sure why the fall triggered it.  However her symptoms sound like GERD.  Try Protonix 40 mg daily and reassess in 1 week or sooner if worse.  If she develops melena or hematochezia or hematemesis seek medical care immediately

## 2020-09-30 ENCOUNTER — Ambulatory Visit: Payer: Self-pay

## 2020-09-30 ENCOUNTER — Encounter: Payer: Self-pay | Admitting: Internal Medicine

## 2020-10-20 ENCOUNTER — Other Ambulatory Visit: Payer: Self-pay | Admitting: Family Medicine

## 2020-10-20 DIAGNOSIS — I10 Essential (primary) hypertension: Secondary | ICD-10-CM

## 2020-12-08 ENCOUNTER — Telehealth: Payer: Self-pay

## 2020-12-08 NOTE — Telephone Encounter (Signed)
Pt states she has off an on back pain from a fall in 1971 or 1972 Pt is requesting pain medication. Pt has taken OTC Goody's powder with no relief, pt has not taken Tylenol or Ibuprofen.   Please advise, thanks!

## 2020-12-08 NOTE — Telephone Encounter (Signed)
Spoke with pt and gave recommendations. Advised her to take consistently for at least 1 week and let us know how she is doing. Pt voiced understanding, nothing further needed

## 2021-01-28 ENCOUNTER — Other Ambulatory Visit: Payer: Self-pay | Admitting: Family Medicine

## 2021-01-28 DIAGNOSIS — I10 Essential (primary) hypertension: Secondary | ICD-10-CM

## 2021-03-03 ENCOUNTER — Other Ambulatory Visit: Payer: Self-pay | Admitting: Family Medicine

## 2021-03-12 ENCOUNTER — Ambulatory Visit (INDEPENDENT_AMBULATORY_CARE_PROVIDER_SITE_OTHER): Payer: Medicare Other

## 2021-03-12 ENCOUNTER — Ambulatory Visit
Admission: EM | Admit: 2021-03-12 | Discharge: 2021-03-12 | Disposition: A | Discharge status: Home / Self Care | Payer: Medicare Other | Attending: Family Medicine | Admitting: Family Medicine

## 2021-03-12 ENCOUNTER — Other Ambulatory Visit: Payer: Self-pay

## 2021-03-12 DIAGNOSIS — M79674 Pain in right toe(s): Secondary | ICD-10-CM

## 2021-03-12 DIAGNOSIS — S90111A Contusion of right great toe without damage to nail, initial encounter: Secondary | ICD-10-CM | POA: Diagnosis not present

## 2021-03-12 DIAGNOSIS — M19071 Primary osteoarthritis, right ankle and foot: Secondary | ICD-10-CM | POA: Diagnosis not present

## 2021-03-12 MED ORDER — TRAMADOL HCL 50 MG PO TABS: 50.0000 mg | ORAL_TABLET | Freq: Every evening | ORAL | 0 refills | Status: DC | PRN
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
## 2021-03-12 NOTE — ED Triage Notes (Signed)
Pt reports swelling and pian in right big toe x 1 day after she hit it  with the steps.  ?

## 2021-04-21 ENCOUNTER — Ambulatory Visit: Payer: Medicare Other | Admitting: Family Medicine

## 2021-04-24 ENCOUNTER — Ambulatory Visit (INDEPENDENT_AMBULATORY_CARE_PROVIDER_SITE_OTHER): Payer: Medicare Other | Admitting: Family Medicine

## 2021-04-24 VITALS — BP 132/70 | HR 88 | Temp 96.0°F | Ht 60.0 in | Wt 195.2 lb

## 2021-04-24 DIAGNOSIS — M25531 Pain in right wrist: Secondary | ICD-10-CM

## 2021-04-24 DIAGNOSIS — M79641 Pain in right hand: Secondary | ICD-10-CM | POA: Diagnosis not present

## 2021-04-24 MED ORDER — HYDROCODONE-ACETAMINOPHEN 5-325 MG PO TABS: 1.0000 | ORAL_TABLET | Freq: Four times a day (QID) | ORAL | 0 refills | Status: DC | PRN

## 2021-04-24 NOTE — Progress Notes (Signed)
? ?Subjective:  ? ? Patient ID: [name], female    DOB: 03/28/1950, 71 y.o.   MRN: [id number] ?Patient fell Monday.  She fell on her right hand.  Today she has significant bruising over the fourth and fifth digits, the fourth and fifth metatarsal, and the ulnar aspect of her right wrist.  She has tenderness to palpation over the fourth and fifth metatarsals.  There is no palpable deformity however there is visible swelling.  She has pain with flexion and extension of her wrist as well as gripping her hand.  I suspect a boxer's fracture. ?Past Medical History:  ?Diagnosis Date  ? Chronic back pain   ? Chronic pain in left shoulder   ? Depression   ? Dysphagia   ? GERD (gastroesophageal reflux disease)   ? Headache(784.0)   ? Hypertension   ? Vertigo   ? ?Past Surgical History:  ?Procedure Laterality Date  ? COLONOSCOPY    ? ESOPHAGEAL DILATION N/A 11/20/2014  ? Procedure: ESOPHAGEAL DILATION;  Surgeon: Corbin Ade, MD;  [facility];  Service: Endoscopy;  Laterality: N/A;  ? ESOPHAGOGASTRODUODENOSCOPY    ? ESOPHAGOGASTRODUODENOSCOPY N/A 11/10/2014  ? WUJ:WJXBJYNWG schatzkis ringerosive reflux esophagitis  ? ESOPHAGOGASTRODUODENOSCOPY N/A 11/20/2014  ? NFA:OZHYQMVH'Q ring s/p dilation/HH  ? TRACHEOSTOMY    ? TUBAL LIGATION    ? ?Current Outpatient Medications on File Prior to Visit  ?Medication Sig Dispense Refill  ? albuterol (VENTOLIN HFA) 108 (90 Base) MCG/ACT inhaler Inhale 2 puffs into the lungs every 6 (six) hours as needed for wheezing or shortness of breath. 1 Inhaler 0  ? calcium carbonate (TUMS - DOSED IN MG ELEMENTAL CALCIUM) 500 MG chewable tablet Chew 1 tablet by mouth daily.    ? diclofenac Sodium (VOLTAREN) 1 % GEL Apply 4 g topically 4 (four) times daily. 100 g 0  ? escitalopram (LEXAPRO) 20 MG tablet Take 1 tablet (20 mg total) by mouth daily. 90 tablet 3  ? metoprolol succinate (TOPROL-XL) 50 MG 24 hr tablet TAKE (1) TABLET BY MOUTH ONCE DAILY. TAKE WITH OR IMMEDIATELY FOLLOWING  A MEAL. 90 tablet 3  ? pantoprazole (PROTONIX) 40 MG tablet TAKE (1) TABLET BY MOUTH ONCE DAILY. 90 tablet 3  ? traMADol (ULTRAM) 50 MG tablet Take 1 tablet (50 mg total) by mouth at bedtime as needed. 5 tablet 0  ? ?No current facility-administered medications on file prior to visit.  ? ?No Known Allergies ?Social History  ? ?Socioeconomic History  ? Marital status: Married  ?  Spouse name: Not on file  ? Number of children: Not on file  ? Years of education: Not on file  ? Highest education level: Not on file  ?Occupational History  ? Not on file  ?Tobacco Use  ? Smoking status: Never  ? Smokeless tobacco: Never  ?Substance and Sexual Activity  ? Alcohol use: No  ? Drug use: Never  ? Sexual activity: Not Currently  ?Other Topics Concern  ? Not on file  ?Social History Narrative  ? Not on file  ? ?Social Determinants of Health  ? ?Financial Resource Strain: Not on file  ?Food Insecurity: Not on file  ?Transportation Needs: Not on file  ?Physical Activity: Not on file  ?Stress: Not on file  ?Social Connections: Not on file  ?Intimate Partner Violence: Not on file  ? ? ? ?Review of Systems  ?Musculoskeletal:  Positive for back pain.  ?All other systems reviewed and are negative. ? ?   ?  Objective:  ? Physical Exam ?Vitals reviewed.  ?Constitutional:   ?   General: She is not in acute distress. ?   Appearance: She is well-developed. She is obese. She is not ill-appearing or toxic-appearing.  ?HENT:  ?   Mouth/Throat:  ?   Mouth: Mucous membranes are moist.  ?   Pharynx: No oropharyngeal exudate or posterior oropharyngeal erythema.  ?Neck:  ?   Vascular: No carotid bruit.  ?Cardiovascular:  ?   Rate and Rhythm: Normal rate and regular rhythm.  ?   Pulses: Normal pulses.  ?   Heart sounds: Normal heart sounds. No murmur heard. ?  No friction rub. No gallop.  ?Pulmonary:  ?   Effort: Pulmonary effort is normal. No respiratory distress.  ?   Breath sounds: Normal breath sounds. No stridor. No wheezing, rhonchi or rales.   ?Chest:  ?   Chest wall: No deformity, swelling, tenderness, crepitus or edema.  ?Abdominal:  ?   General: Bowel sounds are normal. There is no distension.  ?   Palpations: Abdomen is soft. There is no mass.  ?   Tenderness: There is no abdominal tenderness. There is no guarding or rebound.  ?   Hernia: No hernia is present.  ?Musculoskeletal:  ?   Right wrist: Swelling, tenderness and bony tenderness present. No snuff box tenderness. Decreased range of motion.  ?   Right hand: Swelling, tenderness and bony tenderness present. Decreased range of motion. Decreased strength.  ?   Cervical back: Neck supple.  ?   Lumbar back: Tenderness and bony tenderness present. Decreased range of motion.  ?   Right lower leg: No edema.  ?   Left lower leg: No edema.  ?Lymphadenopathy:  ?   Cervical: No cervical adenopathy.  ?Neurological:  ?   General: No focal deficit present.  ?   Mental Status: She is alert and oriented to person, place, and time.  ?   Cranial Nerves: No cranial nerve deficit.  ?   Motor: No weakness.  ?   Coordination: Coordination normal.  ? ? ? ? ? ?   ?Assessment & Plan:  ?Right wrist pain - Plan: DG Hand Complete Right, DG Wrist Complete Right ? ?Hand pain, right - Plan: DG Hand Complete Right, DG Wrist Complete Right ?Fact the patient may have suffered a boxer's fracture and fractured her fourth and fifth metatarsals.  I am also concerned about her distal ulna.  Therefore I will obtain an x-ray of her right wrist as well as her right hand.  I placed the patient in splint until we have the results of the x-rays back to determine the next step in treatment. ?

## 2021-04-27 ENCOUNTER — Ambulatory Visit (HOSPITAL_COMMUNITY)
Admission: RE | Admit: 2021-04-27 | Discharge: 2021-04-27 | Disposition: A | Discharge status: Home / Self Care | Payer: Medicare Other | Source: Ambulatory Visit | Attending: Family Medicine | Admitting: Family Medicine

## 2021-04-27 DIAGNOSIS — S62616A Displaced fracture of proximal phalanx of right little finger, initial encounter for closed fracture: Secondary | ICD-10-CM | POA: Diagnosis not present

## 2021-04-27 DIAGNOSIS — M79641 Pain in right hand: Secondary | ICD-10-CM | POA: Diagnosis not present

## 2021-04-27 DIAGNOSIS — M25531 Pain in right wrist: Secondary | ICD-10-CM | POA: Diagnosis not present

## 2021-04-27 DIAGNOSIS — S6291XA Unspecified fracture of right wrist and hand, initial encounter for closed fracture: Secondary | ICD-10-CM | POA: Diagnosis not present

## 2021-04-29 ENCOUNTER — Telehealth: Payer: Self-pay

## 2021-04-29 NOTE — Telephone Encounter (Signed)
Pt called in wanting to discuss her xray results of her hand. Please advise. ? ?Cb#: [phone] ?

## 2021-04-30 ENCOUNTER — Other Ambulatory Visit: Payer: Self-pay

## 2021-04-30 DIAGNOSIS — S62606A Fracture of unspecified phalanx of right little finger, initial encounter for closed fracture: Secondary | ICD-10-CM

## 2021-05-07 NOTE — Telephone Encounter (Signed)
Please call pt to schedule OV 

## 2021-05-11 ENCOUNTER — Other Ambulatory Visit: Payer: Self-pay | Admitting: Family Medicine

## 2021-05-12 NOTE — Telephone Encounter (Signed)
Pt has been scheduled for 5/4

## 2021-05-12 NOTE — Telephone Encounter (Signed)
Requested medications are due for refill today.  Unsure - provider to determine ? ?Requested medications are on the active medications list.  yes ? ?Last refill. 04/24/2021 #30 0 refills ? ?Future visit scheduled.   no ? ?Notes to clinic.  Medication refill is not delegated. ? ? ? ?Requested Prescriptions  ?Pending Prescriptions Disp Refills  ? HYDROcodone-acetaminophen (NORCO/VICODIN) 5-325 MG tablet [Pharmacy Med Name: HYDROCODONE/APAP 5/325] 30 tablet 0  ?  Sig: Take 1 tablet by mouth every 6 (six) hours as needed for moderate pain.  ?  ? Not Delegated - Analgesics:  Opioid Agonist Combinations Failed - 05/11/2021 12:52 PM  ?  ?  Failed - This refill cannot be delegated  ?  ?  Failed - Urine Drug Screen completed in last 360 days  ?  ?  Passed - Valid encounter within last 3 months  ?  Recent Outpatient Visits   ? ?      ? 2 weeks ago Right wrist pain  ? [facility] Pickard, Cammie Mcgee, MD  ? 7 months ago Gastroesophageal reflux disease, unspecified whether esophagitis present  ? [facility] Pickard, Cammie Mcgee, MD  ? 9 months ago Pedal edema  ? Garden, Jessica A, NP  ? 10 months ago Left arm pain  ? [facility] Pickard, Cammie Mcgee, MD  ? 11 months ago Acute pain of left knee  ? Greenlee Eulogio Bear, NP  ? ?  ?  ? ? ?  ?  ?  ?  ?

## 2021-05-14 ENCOUNTER — Ambulatory Visit (INDEPENDENT_AMBULATORY_CARE_PROVIDER_SITE_OTHER): Payer: Medicare Other | Admitting: Family Medicine

## 2021-05-14 VITALS — BP 150/82 | HR 80 | Temp 97.7°F | Ht 61.0 in | Wt 191.8 lb

## 2021-05-14 DIAGNOSIS — S62606A Fracture of unspecified phalanx of right little finger, initial encounter for closed fracture: Secondary | ICD-10-CM | POA: Diagnosis not present

## 2021-05-14 NOTE — Telephone Encounter (Signed)
LOV 04/24/21 ?Last refill 04/24/21, #30, 0 refills ? ?Please review, thanks! ? ?

## 2021-05-14 NOTE — Progress Notes (Signed)
? ?Subjective:  ? ? Patient ID: [name], female    DOB: [birth year], 71 y.o.   MRN: [id number] ?04/24/21 ?Patient fell Monday.  She fell on her right hand.  Today she has significant bruising over the fourth and fifth digits, the fourth and fifth metatarsal, and the ulnar aspect of her right wrist.  She has tenderness to palpation over the fourth and fifth metatarsals.  There is no palpable deformity however there is visible swelling.  She has pain with flexion and extension of her wrist as well as gripping her hand.  I suspect a boxer's fracture.  At that time, my pln was: ? ?Fact the patient may have suffered a boxer's fracture and fractured her fourth and fifth metatarsals.  I am also concerned about her distal ulna.  Therefore I will obtain an x-ray of her right wrist as well as her right hand.  I placed the patient in splint until we have the results of the x-rays back to determine the next step in treatment. ? ?05/14/21 ?X-ray showed a fracture of the proximal phalanx of the fifth digit on the right hand.  The joint is not involved.  Patient was recommended to wear her splint for the next 2 weeks and then follow-up with me.  She stopped wearing the splint approximately 1 week ago.  She is here today for follow-up.  She still reports some pain in the proximal phalanx of the fifth digit however the swelling and bruising has completely subsided.  She has full range of motion in the MCP joints of all the digits on her right hand.  She has no pain in her wrist.  She has full flexion and extension of the wrist.  There is no visible swelling. ?Past Medical History:  ?Diagnosis Date  ? Chronic back pain   ? Chronic pain in left shoulder   ? Depression   ? Dysphagia   ? GERD (gastroesophageal reflux disease)   ? Headache(784.0)   ? Hypertension   ? Vertigo   ? ?Past Surgical History:  ?Procedure Laterality Date  ? COLONOSCOPY    ? ESOPHAGEAL DILATION N/A 11/20/2014  ? Procedure: ESOPHAGEAL DILATION;  Surgeon:  Corbin Ade, MD;  [facility];  Service: Endoscopy;  Laterality: N/A;  ? ESOPHAGOGASTRODUODENOSCOPY    ? ESOPHAGOGASTRODUODENOSCOPY N/A 11/10/2014  ? WUJ:WJXBJYNWG schatzkis ringerosive reflux esophagitis  ? ESOPHAGOGASTRODUODENOSCOPY N/A 11/20/2014  ? NFA:OZHYQMVH'Q ring s/p dilation/HH  ? TRACHEOSTOMY    ? TUBAL LIGATION    ? ?Current Outpatient Medications on File Prior to Visit  ?Medication Sig Dispense Refill  ? albuterol (VENTOLIN HFA) 108 (90 Base) MCG/ACT inhaler Inhale 2 puffs into the lungs every 6 (six) hours as needed for wheezing or shortness of breath. 1 Inhaler 0  ? calcium carbonate (TUMS - DOSED IN MG ELEMENTAL CALCIUM) 500 MG chewable tablet Chew 1 tablet by mouth daily.    ? diclofenac Sodium (VOLTAREN) 1 % GEL Apply 4 g topically 4 (four) times daily. 100 g 0  ? escitalopram (LEXAPRO) 20 MG tablet Take 1 tablet (20 mg total) by mouth daily. 90 tablet 3  ? HYDROcodone-acetaminophen (NORCO/VICODIN) 5-325 MG tablet Take 1 tablet by mouth every 6 (six) hours as needed for moderate pain. 30 tablet 0  ? metoprolol succinate (TOPROL-XL) 50 MG 24 hr tablet TAKE (1) TABLET BY MOUTH ONCE DAILY. TAKE WITH OR IMMEDIATELY FOLLOWING A MEAL. 90 tablet 3  ? pantoprazole (PROTONIX) 40 MG tablet TAKE (1) TABLET BY MOUTH  ONCE DAILY. 90 tablet 3  ? traMADol (ULTRAM) 50 MG tablet Take 1 tablet (50 mg total) by mouth at bedtime as needed. 5 tablet 0  ? ?No current facility-administered medications on file prior to visit.  ? ?No Known Allergies ?Social History  ? ?Socioeconomic History  ? Marital status: Married  ?  Spouse name: Not on file  ? Number of children: Not on file  ? Years of education: Not on file  ? Highest education level: Not on file  ?Occupational History  ? Not on file  ?Tobacco Use  ? Smoking status: Never  ? Smokeless tobacco: Never  ?Substance and Sexual Activity  ? Alcohol use: No  ? Drug use: Never  ? Sexual activity: Not Currently  ?Other Topics Concern  ? Not on file  ?Social  History Narrative  ? Not on file  ? ?Social Determinants of Health  ? ?Financial Resource Strain: Not on file  ?Food Insecurity: Not on file  ?Transportation Needs: Not on file  ?Physical Activity: Not on file  ?Stress: Not on file  ?Social Connections: Not on file  ?Intimate Partner Violence: Not on file  ? ? ? ?Review of Systems  ?Musculoskeletal:  Positive for back pain.  ?All other systems reviewed and are negative. ? ?   ?Objective:  ? Physical Exam ?Vitals reviewed.  ?Constitutional:   ?   General: She is not in acute distress. ?   Appearance: She is well-developed. She is obese. She is not ill-appearing or toxic-appearing.  ?HENT:  ?   Mouth/Throat:  ?   Mouth: Mucous membranes are moist.  ?   Pharynx: No oropharyngeal exudate or posterior oropharyngeal erythema.  ?Neck:  ?   Vascular: No carotid bruit.  ?Cardiovascular:  ?   Rate and Rhythm: Normal rate and regular rhythm.  ?   Pulses: Normal pulses.  ?   Heart sounds: Normal heart sounds. No murmur heard. ?  No friction rub. No gallop.  ?Pulmonary:  ?   Effort: Pulmonary effort is normal. No respiratory distress.  ?   Breath sounds: Normal breath sounds. No stridor. No wheezing, rhonchi or rales.  ?Chest:  ?   Chest wall: No deformity, swelling, tenderness, crepitus or edema.  ?Abdominal:  ?   General: Bowel sounds are normal. There is no distension.  ?   Palpations: Abdomen is soft. There is no mass.  ?   Tenderness: There is no abdominal tenderness. There is no guarding or rebound.  ?   Hernia: No hernia is present.  ?Musculoskeletal:  ?   Right wrist: No swelling, tenderness, bony tenderness or snuff box tenderness. Normal range of motion.  ?   Right hand: Bony tenderness present. No swelling or tenderness. Normal range of motion. Normal strength.  ?   Cervical back: Neck supple.  ?   Lumbar back: Tenderness and bony tenderness present. Decreased range of motion.  ?   Right lower leg: No edema.  ?   Left lower leg: No edema.  ?Lymphadenopathy:  ?    Cervical: No cervical adenopathy.  ?Neurological:  ?   General: No focal deficit present.  ?   Mental Status: She is alert and oriented to person, place, and time.  ?   Cranial Nerves: No cranial nerve deficit.  ?   Motor: No weakness.  ?   Coordination: Coordination normal.  ? ? ? ? ? ?   ?Assessment & Plan:  ?Closed nondisplaced fracture of phalanx of right little finger,  unspecified phalanx, initial encounter ?Situation is improving.  I believe the fracture is healing however the patient is being noncompliant with wearing splint.  Therefore I gave the patient an aluminum finger splint to wear we will be more convenient for her and encouraged her to wear it for the next 2 weeks and avoid the condition is and activities that apply her repetitive motions with her right hand.  I then believe that she can discontinue the splint and increase activity as tolerated ?

## 2021-05-25 ENCOUNTER — Telehealth: Payer: Self-pay

## 2021-05-25 ENCOUNTER — Ambulatory Visit (INDEPENDENT_AMBULATORY_CARE_PROVIDER_SITE_OTHER): Payer: Medicare Other

## 2021-05-25 ENCOUNTER — Ambulatory Visit
Admission: EM | Admit: 2021-05-25 | Discharge: 2021-05-25 | Disposition: A | Discharge status: Home / Self Care | Payer: Medicare Other | Attending: Internal Medicine | Admitting: Internal Medicine

## 2021-05-25 DIAGNOSIS — W19XXXA Unspecified fall, initial encounter: Secondary | ICD-10-CM

## 2021-05-25 DIAGNOSIS — M79644 Pain in right finger(s): Secondary | ICD-10-CM

## 2021-05-25 DIAGNOSIS — S62646A Nondisplaced fracture of proximal phalanx of right little finger, initial encounter for closed fracture: Secondary | ICD-10-CM | POA: Diagnosis not present

## 2021-05-25 NOTE — Telephone Encounter (Signed)
Pt called to stated that she fell on Sat.,05/23/21. pt stated that her R-'pinky'finger is hurting pretty bad. Pt wanted a refill on her hydrocodone-acetaminophen, however, it was just refill on the 05/14/21 and it's not up for refill yet.  ? ?I did suggest that she can come in if she feels that the pain is too much so you can check it over. (Avail time PM slots). Pt stated that she can't come in due to having visitors. I also stated that she can also do to urgent care if need to.  ? ? ?Pls advice? ?

## 2021-05-25 NOTE — ED Triage Notes (Signed)
Pt states she fell last Saturday and fell and caught herself with the right hand and injured her pinky finger for the 2nd time ? ?Pt states she broke her pinky finger the 1st time on April 10th and was given Hydrocodone for the pain ?

## 2021-05-25 NOTE — Discharge Instructions (Signed)
Your x-ray today did not show a new fracture, just the healing fracture from several weeks ago.  Ice, elevation, continue the splint for comfort and follow-up with orthopedics for recheck. ?
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
## 2021-06-04 ENCOUNTER — Other Ambulatory Visit: Payer: Self-pay | Admitting: Family Medicine

## 2021-06-04 NOTE — Telephone Encounter (Signed)
Last filled 06/04/21 Last OV 05/14/21 Next ov  none

## 2021-06-05 ENCOUNTER — Other Ambulatory Visit: Payer: Self-pay | Admitting: Family Medicine

## 2021-06-05 NOTE — Telephone Encounter (Signed)
Patient called to request refill of HYDROcodone-acetaminophen (NORCO/VICODIN) 5-325 MG tablet  ; still having a lot of pain from broken finger.  Pharmacy confirmed as  [facility] - [place], [place] - [id number]  [address], [place] [address]  Phone:  [phone]  Fax:  [phone]   Patient requesting call when Rx called in so she can go pick it up.  Please advise at [phone]

## 2021-06-05 NOTE — Telephone Encounter (Signed)
Requested medication (s) are due for refill today - no  Requested medication (s) are on the active medication list -yes  Future visit scheduled -no  Last refill: today  Notes to clinic: Duplicate request- non delegated Rx- unable to decline  Requested Prescriptions  Pending Prescriptions Disp Refills   HYDROcodone-acetaminophen (NORCO/VICODIN) 5-325 MG tablet 30 tablet 0    Sig: Take 1 tablet by mouth every 6 (six) hours as needed.     Not Delegated - Analgesics:  Opioid Agonist Combinations Failed - 06/05/2021  1:05 PM      Failed - This refill cannot be delegated      Failed - Urine Drug Screen completed in last 360 days      Passed - Valid encounter within last 3 months    Recent Outpatient Visits           3 weeks ago Closed nondisplaced fracture of phalanx of right little finger, unspecified phalanx, initial encounter   [facility] Pickard, Priscille Heidelberg, MD   1 month ago Right wrist pain   [facility] Donita Brooks, MD   8 months ago Gastroesophageal reflux disease, unspecified whether esophagitis present   [facility] Tanya Nones, Priscille Heidelberg, MD   10 months ago Pedal edema   [facility] Valentino Nose, NP   11 months ago Left arm pain   [facility] Pickard, Priscille Heidelberg, MD                  Requested Prescriptions  Pending Prescriptions Disp Refills   HYDROcodone-acetaminophen (NORCO/VICODIN) 5-325 MG tablet 30 tablet 0    Sig: Take 1 tablet by mouth every 6 (six) hours as needed.     Not Delegated - Analgesics:  Opioid Agonist Combinations Failed - 06/05/2021  1:05 PM      Failed - This refill cannot be delegated      Failed - Urine Drug Screen completed in last 360 days      Passed - Valid encounter within last 3 months    Recent Outpatient Visits           3 weeks ago Closed nondisplaced fracture of phalanx of right little finger, unspecified phalanx, initial encounter    [facility] Tanya Nones, Priscille Heidelberg, MD   1 month ago Right wrist pain   [facility] Donita Brooks, MD   8 months ago Gastroesophageal reflux disease, unspecified whether esophagitis present   [facility] Donita Brooks, MD   10 months ago Pedal edema   [facility] Valentino Nose, NP   11 months ago Left arm pain   [facility] Pickard, Priscille Heidelberg, MD

## 2021-06-24 ENCOUNTER — Telehealth: Payer: Self-pay | Admitting: Family Medicine

## 2021-06-24 NOTE — Telephone Encounter (Signed)
Left message for patient to call back and schedule Medicare Annual Wellness Visit (AWV) in office.  ° °If not able to come in office, please offer to do virtually or by telephone.  Left office number and my jabber #[phone]. ° °AWVI eligible as of 01/11/2009 ° °Please schedule at anytime with Nurse Health Advisor. °  °

## 2021-07-01 ENCOUNTER — Other Ambulatory Visit: Payer: Self-pay | Admitting: Family Medicine

## 2021-07-01 NOTE — Telephone Encounter (Signed)
LOV 06/24/21 Last refill 06/05/21, #30, 0 refills  Please review, thanks!

## 2021-07-08 NOTE — Patient Instructions (Signed)
[name] , Thank you for taking time to come for your Medicare Wellness Visit. I appreciate your ongoing commitment to your health goals. Please review the following plan we discussed and let me know if I can assist you in the future.   Screening recommendations/referrals: Colonoscopy: Done 01/11/2010 Repeat in 10 years  Mammogram: Due yearly. Bone Density: Due every 2 years.  Recommended yearly ophthalmology/optometry visit for glaucoma screening and checkup Recommended yearly dental visit for hygiene and checkup  Vaccinations: Influenza vaccine: Due Fall 2023. Pneumococcal vaccine: Prevnar-20 available at your local pharmacy.  Tdap vaccine: Due every 10 years. Shingles vaccine: Available at your local pharmacy.   Covid-19:Done 12/13/2019, 04/27/2019, 03/29/2019.  Advanced directives: Please bring a copy of your health care power of attorney and living will to the office to be added to your chart at your convenience.   Conditions/risks identified: Aim for 30 minutes of exercise or brisk walking, 6-8 glasses of water, and 5 servings of fruits and vegetables each day.   Next appointment: Follow up in one year for your annual wellness visit 2024.   Preventive Care 63 Years and Older, Female Preventive care refers to lifestyle choices and visits with your health care provider that can promote health and wellness. What does preventive care include? A yearly physical exam. This is also called an annual well check. Dental exams once or twice a year. Routine eye exams. Ask your health care provider how often you should have your eyes checked. Personal lifestyle choices, including: Daily care of your teeth and gums. Regular physical activity. Eating a healthy diet. Avoiding tobacco and drug use. Limiting alcohol use. Practicing safe sex. Taking low-dose aspirin every day. Taking vitamin and mineral supplements as recommended by your health care provider. What happens during an  annual well check? The services and screenings done by your health care provider during your annual well check will depend on your age, overall health, lifestyle risk factors, and family history of disease. Counseling  Your health care provider may ask you questions about your: Alcohol use. Tobacco use. Drug use. Emotional well-being. Home and relationship well-being. Sexual activity. Eating habits. History of falls. Memory and ability to understand (cognition). Work and work Astronomer. Reproductive health. Screening  You may have the following tests or measurements: Height, weight, and BMI. Blood pressure. Lipid and cholesterol levels. These may be checked every 5 years, or more frequently if you are over 68 years old. Skin check. Lung cancer screening. You may have this screening every year starting at age 3 if you have a 30-pack-year history of smoking and currently smoke or have quit within the past 15 years. Fecal occult blood test (FOBT) of the stool. You may have this test every year starting at age 63. Flexible sigmoidoscopy or colonoscopy. You may have a sigmoidoscopy every 5 years or a colonoscopy every 10 years starting at age 67. Hepatitis C blood test. Hepatitis B blood test. Sexually transmitted disease (STD) testing. Diabetes screening. This is done by checking your blood sugar (glucose) after you have not eaten for a while (fasting). You may have this done every 1-3 years. Bone density scan. This is done to screen for osteoporosis. You may have this done starting at age 28. Mammogram. This may be done every 1-2 years. Talk to your health care provider about how often you should have regular mammograms. Talk with your health care provider about your test results, treatment options, and if necessary, the need for more tests. Vaccines  Your  health care provider may recommend certain vaccines, such as: Influenza vaccine. This is recommended every year. Tetanus,  diphtheria, and acellular pertussis (Tdap, Td) vaccine. You may need a Td booster every 10 years. Zoster vaccine. You may need this after age 75. Pneumococcal 13-valent conjugate (PCV13) vaccine. One dose is recommended after age 68. Pneumococcal polysaccharide (PPSV23) vaccine. One dose is recommended after age 77. Talk to your health care provider about which screenings and vaccines you need and how often you need them. This information is not intended to replace advice given to you by your health care provider. Make sure you discuss any questions you have with your health care provider. Document Released: 01/24/2015 Document Revised: 09/17/2015 Document Reviewed: 10/29/2014 Elsevier Interactive Patient Education  2017 [place] Prevention in the Home Falls can cause injuries. They can happen to people of all ages. There are many things you can do to make your home safe and to help prevent falls. What can I do on the outside of my home? Regularly fix the edges of walkways and driveways and fix any cracks. Remove anything that might make you trip as you walk through a door, such as a raised step or threshold. Trim any bushes or trees on the path to your home. Use bright outdoor lighting. Clear any walking paths of anything that might make someone trip, such as rocks or tools. Regularly check to see if handrails are loose or broken. Make sure that both sides of any steps have handrails. Any raised decks and porches should have guardrails on the edges. Have any leaves, snow, or ice cleared regularly. Use sand or salt on walking paths during winter. Clean up any spills in your garage right away. This includes oil or grease spills. What can I do in the bathroom? Use night lights. Install grab bars by the toilet and in the tub and shower. Do not use towel bars as grab bars. Use non-skid mats or decals in the tub or shower. If you need to sit down in the shower, use a plastic,  non-slip stool. Keep the floor dry. Clean up any water that spills on the floor as soon as it happens. Remove soap buildup in the tub or shower regularly. Attach bath mats securely with double-sided non-slip rug tape. Do not have throw rugs and other things on the floor that can make you trip. What can I do in the bedroom? Use night lights. Make sure that you have a light by your bed that is easy to reach. Do not use any sheets or blankets that are too big for your bed. They should not hang down onto the floor. Have a firm chair that has side arms. You can use this for support while you get dressed. Do not have throw rugs and other things on the floor that can make you trip. What can I do in the kitchen? Clean up any spills right away. Avoid walking on wet floors. Keep items that you use a lot in easy-to-reach places. If you need to reach something above you, use a strong step stool that has a grab bar. Keep electrical cords out of the way. Do not use floor polish or wax that makes floors slippery. If you must use wax, use non-skid floor wax. Do not have throw rugs and other things on the floor that can make you trip. What can I do with my stairs? Do not leave any items on the stairs. Make sure that there are handrails  on both sides of the stairs and use them. Fix handrails that are broken or loose. Make sure that handrails are as long as the stairways. Check any carpeting to make sure that it is firmly attached to the stairs. Fix any carpet that is loose or worn. Avoid having throw rugs at the top or bottom of the stairs. If you do have throw rugs, attach them to the floor with carpet tape. Make sure that you have a light switch at the top of the stairs and the bottom of the stairs. If you do not have them, ask someone to add them for you. What else can I do to help prevent falls? Wear shoes that: Do not have high heels. Have rubber bottoms. Are comfortable and fit you well. Are closed  at the toe. Do not wear sandals. If you use a stepladder: Make sure that it is fully opened. Do not climb a closed stepladder. Make sure that both sides of the stepladder are locked into place. Ask someone to hold it for you, if possible. Clearly mark and make sure that you can see: Any grab bars or handrails. First and last steps. Where the edge of each step is. Use tools that help you move around (mobility aids) if they are needed. These include: Canes. Walkers. Scooters. Crutches. Turn on the lights when you go into a dark area. Replace any light bulbs as soon as they burn out. Set up your furniture so you have a clear path. Avoid moving your furniture around. If any of your floors are uneven, fix them. If there are any pets around you, be aware of where they are. Review your medicines with your doctor. Some medicines can make you feel dizzy. This can increase your chance of falling. Ask your doctor what other things that you can do to help prevent falls. This information is not intended to replace advice given to you by your health care provider. Make sure you discuss any questions you have with your health care provider. Document Released: 10/24/2008 Document Revised: 06/05/2015 Document Reviewed: 02/01/2014 Elsevier Interactive Patient Education  2017 [employer].

## 2021-07-09 ENCOUNTER — Ambulatory Visit (INDEPENDENT_AMBULATORY_CARE_PROVIDER_SITE_OTHER): Payer: Medicare Other

## 2021-07-09 ENCOUNTER — Telehealth: Payer: Self-pay

## 2021-07-09 VITALS — Ht 61.0 in | Wt 191.0 lb

## 2021-07-09 DIAGNOSIS — Z0000 Encounter for general adult medical examination without abnormal findings: Secondary | ICD-10-CM

## 2021-07-09 DIAGNOSIS — F321 Major depressive disorder, single episode, moderate: Secondary | ICD-10-CM

## 2021-07-09 DIAGNOSIS — T7491XA Unspecified adult maltreatment, confirmed, initial encounter: Secondary | ICD-10-CM
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
## 2021-07-10 ENCOUNTER — Telehealth: Payer: Self-pay | Admitting: *Deleted

## 2021-07-10 ENCOUNTER — Ambulatory Visit (INDEPENDENT_AMBULATORY_CARE_PROVIDER_SITE_OTHER): Payer: Medicare Other | Admitting: Licensed Clinical Social Worker

## 2021-07-10 DIAGNOSIS — F321 Major depressive disorder, single episode, moderate: Secondary | ICD-10-CM

## 2021-07-10 DIAGNOSIS — T7491XA Unspecified adult maltreatment, confirmed, initial encounter: Secondary | ICD-10-CM

## 2021-07-10 NOTE — Chronic Care Management (AMB) (Signed)
Care Management   Note  07/10/2021 Name: [name] MRN: [id number] DOB: 09-Aug-1950  [name] is a 71 y.o. year old female who is a primary care patient of Pickard, Priscille Heidelberg, MD. I reached out to [name] by phone today offer care coordination services.   [name] was given information about care management services today including:  Care management services include personalized support from designated clinical staff supervised by her physician, including individualized plan of care and coordination with other care providers 24/7 contact phone numbers for assistance for urgent and routine care needs. The patient may stop care management services at any time by phone call to the office staff.  Patient agreed to services and verbal consent obtained.   Follow up plan: Telephone appointment with care management team member scheduled for:07/10/21  [facility] Guide, Embedded Care Coordination [facility]  Care Management  Direct Dial: [phone]

## 2021-07-15 ENCOUNTER — Telehealth: Payer: Self-pay
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
## 2021-07-15 NOTE — Telephone Encounter (Signed)
Telephone encounter was:  Unsuccessful.  07/15/2021 Name: [name] MRN: [id number] DOB: Oct 18, 1950  Unsuccessful outbound call made today to assist with:  Transportation Needs , Food Insecurity, and Home Modifications  Outreach Attempt:  1st Attempt  A HIPAA compliant voice message was left requesting a return call.  Instructed patient to call back at earliest convenience. Lenard Forth Care Guide, Embedded Care Coordination [facility] Care Management  [phone] [address] [place], [place], [place] [address] Phone: [phone] Email: [email or web address]
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
## 2021-07-17 ENCOUNTER — Telehealth: Payer: Self-pay

## 2021-07-17 NOTE — Telephone Encounter (Signed)
Telephone encounter was:  Successful.  07/17/2021 Name: [name] MRN: [id number] DOB: 01-19-1950  [name] is a 71 y.o. year old female who is a primary care patient of Pickard, Priscille Heidelberg, MD . The community resource team was consulted for assistance with Transportation Needs  and Food Insecurity  Care guide performed the following interventions: Patient provided with information about care guide support team and interviewed to confirm resource needs.Patient has no food or financial strain. Patient also does not need transportation at this time. Patient has my contact information if needed  Follow Up Plan:  No further follow up planned at this time. The patient has been provided with needed resources.    Lenard Forth Care Guide, Embedded Care Coordination [facility], Care Management  [phone] [address] [place], [place], [place] [address] Phone: [phone] Email: [email or web address]

## 2021-08-01 ENCOUNTER — Other Ambulatory Visit: Payer: Self-pay | Admitting: Family Medicine

## 2021-08-03 ENCOUNTER — Ambulatory Visit (INDEPENDENT_AMBULATORY_CARE_PROVIDER_SITE_OTHER): Payer: Medicare Other | Admitting: *Deleted

## 2021-08-03 ENCOUNTER — Encounter: Payer: Self-pay | Admitting: *Deleted

## 2021-08-03 DIAGNOSIS — T7491XA Unspecified adult maltreatment, confirmed, initial encounter: Secondary | ICD-10-CM

## 2021-08-03 DIAGNOSIS — F321 Major depressive disorder, single episode, moderate: Secondary | ICD-10-CM

## 2021-08-03 DIAGNOSIS — F4321 Adjustment disorder with depressed mood: Secondary | ICD-10-CM

## 2021-08-03 NOTE — Patient Instructions (Signed)
Visit Information  Thank you for taking time to visit with me today. Please don't hesitate to contact me if I can be of assistance to you before our next scheduled telephone appointment.  Following are the goals we discussed today:  Patient Goals/Self-Care Activities:  Patient will begin working with LCSW, on a bi-weekly basis, in an effort to obtain financial, transportation, pest control for bed bug infestation, food and nutritional services, and counseling and supportive services, resources, and referrals.   Review list of resources mailed to your home, which include all of the following:  ~ Emergency Assistance Programs in [place] ~ [place] Financial Resources ~ Brink's Company of Becton, Dickinson and Company ~ Resources for Seniors ~ Eli Lilly and Company ~ How to Apply for Adult Medicaid ~ 2023 Medicaid Tips ~ OGE Energy Application  ~ Chiropodist in [place] ~ Countrywide Financial ~ How to Constellation Brands for DTE Energy Company) Scientist, clinical (histocompatibility and immunogenetics) ~ Starbucks Corporation (Part A & B) ~ Morgan Stanley ~ Presenter, broadcasting through Brink's Company of [employer] Brochure ~ UGI Corporation, Games developer and Dyne[employer] ~ How to Constellation Brands for R.R. Donnelley) Supplemental Nutrition Assistance Program ~ Corning Incorporated Application ~ Economist and Resources in [place] [employer] [facility]: Life and [employer] (# [phone]), to follow up on referral for counseling and supportive services. Please accept all calls from [facility] Guide, in an effort to establish services, as well as receive assistance with referrals to community agencies and resources.      Schedule follow up appointment with Primary Care Physician, Dr. Lynnea Ferrier to discuss psychotropic medication management with regards to unresolved symptoms of depression.   Contact LCSW directly (# [id number]), if you have questions, need assistance, or if additional social work needs are identified between now and our next scheduled telephone outreach call. Follow Up Date:  08/12/2021 at 10:45 am    Our next appointment is by telephone on 08/12/2021 at 10:45 am.  Please call the care guide team at [phone] if you need to cancel or reschedule your appointment.   If you are experiencing a Mental Health or Behavioral Health Crisis or need someone to talk to, please call the Suicide and Crisis Lifeline: 988 call the [place] National Suicide Prevention Lifeline: 325-269-2766 or TTY: 910-606-6307 TTY 352-734-3447) to talk to a trained counselor call 1-800-273-TALK (toll free, 24 hour hotline) go to [facility] [address] [place] [phone]) call the [facility] Crisis Line: ([phone] call 911   Patient verbalizes understanding of instructions and care plan provided today and agrees to view in MyChart. Active MyChart status and patient understanding of how to access instructions and care plan via MyChart confirmed with patient.     [name], BSW, MSW, [employer]  Licensed Visual merchandiser  [employer]  Mailing Address-[address] [place], [place] [address] Physical Address-[address], [place], [place] [address] Toll Free Main # [phone] Fax # [phone] Cell # [phone] [email or web address]
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
## 2021-08-10 DIAGNOSIS — F4321 Adjustment disorder with depressed mood: Secondary | ICD-10-CM

## 2021-08-10 DIAGNOSIS — F321 Major depressive disorder, single episode, moderate: Secondary | ICD-10-CM

## 2021-08-12 ENCOUNTER — Ambulatory Visit: Payer: Self-pay | Admitting: *Deleted

## 2021-08-12 ENCOUNTER — Encounter: Payer: Self-pay | Admitting: *Deleted

## 2021-08-12 NOTE — Patient Instructions (Signed)
Visit Information  Thank you for taking time to visit with me today. Please don't hesitate to contact me if I can be of assistance to you.   Following are the goals we discussed today:   Goals Addressed               This Visit's Progress     Obtain Supportive Resources and Find Help in My Community. (pt-stated)   On track     Interventions: Brief Cognitive Behavioral Therapy Performed. Verbalization of Feelings Encouraged. Emotional Support Provided. Client-Centered Therapy Initiated. Thorough review of the following list of resources:  ~ Emergency Assistance Programs in [place] ~ [place] Financial Resources ~ [employer] of [employer] and Company ~ Resources for Seniors ~ Eli Lilly and Company ~ How to Apply for Adult Medicaid ~ 2023 Medicaid Tips ~ O[employer] Energy Application  ~ Chiropodist in [place] ~ Countrywide Financial ~ How to [employer]ellation [employer] for DTE Energy Company) Scientist, clinical (histocompatibility and immunogenetics) ~ Starbucks Corporation (Part A & B) ~ Morgan Stanley ~ Presenter, broadcasting through [employer] of [place] Brochure ~ UGI Corporation, Games developer and [employer]ne[employer] ~ How to [employer]ellation [employer] for [employer]) Supplemental Nutrition Assistance Program ~ Corning Incorporated Application ~ Economist and Resources in [place] Please contact [facility] [employer] (# [phone]), to follow up on referral for counseling and supportive services. Please accept all calls from [facility] Guide, in an effort to establish services, as well as receive assistance with referrals to community agencies and resources.      Please schedule follow up appointment with Primary Care Physician, Dr. Lynnea Ferrier to discuss psychotropic medication management with regards to unresolved symptoms of depression.  Contact LCSW directly (#  [id number]), if you have questions, need assistance, or if additional social work needs are identified between now and our next scheduled telephone outreach call. Follow Up Date:  08/19/2021 at 10:15 am        Our next appointment is by telephone on 08/19/2021 at 10:15 am.  Please call the care guide team at [phone] if you need to cancel or reschedule your appointment.   If you are experiencing a Mental Health or Behavioral Health Crisis or need someone to talk to, please call the Suicide and Crisis Lifeline: 988 call the [place] National Suicide Prevention Lifeline: 332-802-3626 or TTY: 681-788-3184 TTY (440) 167-8813) to talk to a trained counselor call 1-800-273-TALK (toll free, 24 hour hotline) go to [facility] [address] [place] [phone]) call the [facility] Crisis Line: [phone] call 911  Patient verbalizes understanding of instructions and care plan provided today and agrees to view in MyChart. Active MyChart status and patient understanding of how to access instructions and care plan via MyChart confirmed with patient.     Telephone follow up appointment with care management team member scheduled for:  08/19/2021 at 10:15 am.  Danford Bad, BSW, MSW, LCSW  Licensed Clinical Social Worker  [facility]  Mailing [place]. [address] [place], [place] [address] Physical Address-[address], [place], [place] [address] Toll Free Main # [phone] Fax # [phone] Cell # [phone] [email or web address]

## 2021-08-12 NOTE — Patient Outreach (Signed)
Care Coordination   Follow Up Visit Note   08/12/2021 Name: NIKKY LEISINGER MRN: [id number] DOB: 1950-03-18  [name] is a 71 y.o. year old female who sees Pickard, Priscille Heidelberg, MD for primary care. I spoke with  [name] by phone today.  What matters to the patients health and wellness today?  Stage manager, Assistance with IT sales professional, Referrals to [employer] and Resources, Tax adviser, Receive Mental Health Counseling and Resources, as well as Grief Counseling Services, Relieve Caregiver Stress, and Resolve Financial Difficulties.    Goals Addressed               This Visit's Progress     Obtain Supportive Resources and Find Help in My Community. (pt-stated)   On track     Interventions: Brief Cognitive Behavioral Therapy Performed. Verbalization of Feelings Encouraged. Emotional Support Provided. Client-Centered Therapy Initiated. Thorough review of the following list of resources:  ~ Emergency Assistance Programs in [place] ~ [place] Financial Resources ~ [employer] of [employer] and Company ~ Resources for Seniors ~ Eli Lilly and Company ~ How to Apply for Adult Medicaid ~ 2023 Medicaid Tips ~ O[employer] Energy Application  ~ Chiropodist in [place] ~ Countrywide Financial ~ How to [employer]ellation [employer]ands for DTE Energy Company) Scientist, clinical (histocompatibility and immunogenetics) ~ Starbucks Corporation (Part A & B) ~ Morgan Stanley ~ Presenter, broadcasting through [employer] of [place] Brochure ~ UGI Corporation, Games developer and [employer]ne[employer] ~ How to Constellation Brands for [employer]. [employer]nel[employer]) Supplemental Nutrition Assistance Program ~ Corning Incorporated Application ~ Economist and Resources in [place] Please contact [facility] [employer] (# [phone]), to follow up on referral for  counseling and supportive services. Please accept all calls from [facility] Guide, in an effort to establish services, as well as receive assistance with referrals to community agencies and resources.      Please schedule follow up appointment with Primary Care Physician, Dr. Lynnea Ferrier to discuss psychotropic medication management with regards to unresolved symptoms of depression.  Contact LCSW directly (# [id number]), if you have questions, need assistance, or if additional social work needs are identified between now and our next scheduled telephone outreach call. Follow Up Date:  08/19/2021 at 10:15 am        SDOH assessments and interventions completed:  Yes      Care Coordination Interventions Activated:  Yes  Care Coordination Interventions:  Yes, provided.   Follow up plan: Follow up call scheduled for 08/19/2021 at 10:15 am.  Encounter Outcome:  Pt. Visit Completed.   Danford Bad, BSW, MSW, LCSW  Licensed Restaurant manager, fast food [facility]  Mailing [place] [address]. [address] [place], [place] [address] Physical Address-[address], [place], [place] [address] Toll Free Main # [phone] Fax # [phone] Cell # [phone] [email or web address]

## 2021-08-19 ENCOUNTER — Ambulatory Visit: Payer: Self-pay | Admitting: *Deleted

## 2021-08-19 ENCOUNTER — Encounter: Payer: Self-pay | Admitting: *Deleted

## 2021-08-19 NOTE — Patient Outreach (Signed)
Care Coordination   Follow Up Visit Note   08/19/2021  Name: [name] MRN: [id number] DOB: [birth year]  [name] is a 71 y.o. year old female who sees Pickard, Priscille Heidelberg, MD for primary care. I spoke with [name] by phone today.  What matters to the patients health and wellness today?  Obtain Supportive Resources and Find Help in My Community.   Goals Addressed               This Visit's Progress     Obtain Supportive Resources and Find Help in My Community. (pt-stated)   On track     Care Coordination Interventions:  Cognitive Behavioral Therapy Performed. Verbalization of Feelings Encouraged. Emotional Support Provided. Client-Centered Therapy Initiated. Please contact [facility] (# [phone]), to follow up on referral for counseling and supportive services. Continue to work with [employer], in an effort to obtain transportation, as well as food and nutritional services, resources and referrals.     Please schedule follow up appointment with Primary Care Physician, Dr. Lynnea Ferrier to discuss psychotropic medication management with regards to unresolved symptoms of depression.         SDOH assessments and interventions completed:  Yes  Care Coordination Interventions Activated:  Yes   Care Coordination Interventions:  Yes, provided.   Follow up plan: Follow up call scheduled for 08/26/2021 at 2:00 pm.  Encounter Outcome:  Pt. Visit Completed.   Danford Bad, BSW, MSW, LCSW  Licensed Restaurant manager, fast food [facility]  Mailing [place] [address] [place], [place] [address] Physical Address-[address] [place], [place] [address] Toll Free Main # [phone] Fax # [phone] Cell # [phone] [email or web address]

## 2021-08-19 NOTE — Patient Instructions (Addendum)
Visit Information  Thank you for taking time to visit with me today. Please don't hesitate to contact me if I can be of assistance to you.   Following are the goals we discussed today:   Goals Addressed               This Visit's Progress     Obtain Supportive Resources and Find Help in My Community. (pt-stated)   On track     Care Coordination Interventions:  Cognitive Behavioral Therapy Performed. Verbalization of Feelings Encouraged. Emotional Support Provided. Client-Centered Therapy Initiated. Please contact [facility] (# [phone]), to follow up on referral for counseling and supportive services. Continue to work with [employer], in an effort to obtain transportation, as well as food and nutritional services, resources and referrals.     Please schedule follow up appointment with Primary Care Physician, Dr. Lynnea Ferrier to discuss psychotropic medication management with regards to unresolved symptoms of depression.         Our next appointment is by telephone on 08/26/2021 at 2:00 pm.  Please call the care guide team at [phone] if you need to cancel or reschedule your appointment.   If you are experiencing a Mental Health or Behavioral Health Crisis or need someone to talk to, please call the Suicide and Crisis Lifeline: 988 call the [place] National Suicide Prevention Lifeline: 774-098-7007 or TTY: 9894173289 TTY (906)404-3986) to talk to a trained counselor call 1-800-273-TALK (toll free, 24 hour hotline) go to [facility] Care [address] [place] [phone]) call the [facility] Crisis Line: [phone] call 911  Patient verbalizes understanding of instructions and care plan provided today and agrees to view in MyChart. Active MyChart status and patient understanding of how to access instructions and care plan via MyChart confirmed with patient.     Telephone follow up  appointment with care management team member scheduled for:  08/26/2021 at 2:00 pm.  Danford Bad, BSW, MSW, LCSW  Licensed Clinical Social Worker  [facility]  Mailing [place]. [address] [place], [place] [address] Physical Address-[address] [place], [place] [address] Toll Free Main # [phone] Fax # [phone] Cell # [phone] [email or web address]

## 2021-08-26 ENCOUNTER — Ambulatory Visit: Payer: Self-pay | Admitting: *Deleted

## 2021-08-26 ENCOUNTER — Encounter: Payer: Self-pay | Admitting: *Deleted

## 2021-08-26 NOTE — Patient Instructions (Signed)
Visit Information  Thank you for taking time to visit with me today. Please don't hesitate to contact me if I can be of assistance to you.   Following are the goals we discussed today:   Goals Addressed               This Visit's Progress     Obtain Supportive Resources and Find Help in My Community. (pt-stated)   On track     Care Coordination Interventions:  Cognitive Behavioral Therapy Performed. Verbalization of Feelings Encouraged. Emotional Support Provided. Client-Centered Therapy Initiated. Please contact [facility] (# [phone]), to follow up on referral for counseling and supportive services. Continue to work with [employer], in an effort to obtain transportation, as well as food and nutritional services, resources and referrals.     Please schedule follow up appointment with Primary Care Physician, Dr. Lynnea Ferrier to discuss psychotropic medication management with regards to unresolved symptoms of depression.  Review EMMI Educational Material on "Depression in Adults". Review "Loneliness and Isolation".        Our next appointment is by telephone on 09/03/2021 at 12:30 pm.  Please call the care guide team at [phone] if you need to cancel or reschedule your appointment.   If you are experiencing a Mental Health or Behavioral Health Crisis or need someone to talk to, please call the Suicide and Crisis Lifeline: 988 call the [place] National Suicide Prevention Lifeline: 810-362-1252 or TTY: 908-805-5043 TTY 850-020-5957) to talk to a trained counselor call 1-800-273-TALK (toll free, 24 hour hotline) go to [facility] [address] [place] [phone]) call the [facility] Crisis Line: [phone] call 911  Patient verbalizes understanding of instructions and care plan provided today and agrees to view in MyChart. Active MyChart status and patient understanding of how to  access instructions and care plan via MyChart confirmed with patient.     Telephone follow up appointment with care management team member scheduled for:  09/03/2021 at 12:30 pm.  Danford Bad, BSW, MSW, LCSW  Licensed Clinical Social Worker  [facility]  Mailing [place]. [address], [place], [place] [address] Physical Address-[address] [place], [place] [address] Toll Free Main # [phone] Fax # [phone] Cell # [phone] [email or web address]

## 2021-08-26 NOTE — Patient Outreach (Signed)
Care Coordination   Follow Up Visit Note   08/26/2021  Name: [name] MRN: [id number] DOB: January 17, 1950  [name] is a 72 y.o. year old female who sees Pickard, Priscille Heidelberg, MD for primary care. I spoke with [name] by phone today.  What matters to the patients health and wellness today?  Obtain Supportive Services and Find Help in My Community.    Goals Addressed               This Visit's Progress     Obtain Supportive Resources and Find Help in My Community. (pt-stated)   On track     Care Coordination Interventions:  Cognitive Behavioral Therapy Performed. Verbalization of Feelings Encouraged. Emotional Support Provided. Client-Centered Therapy Initiated. Please contact [facility] (# [phone]), to follow up on referral for counseling and supportive services. Continue to work with [employer], in an effort to obtain transportation, as well as food and nutritional services, resources and referrals.     Please schedule follow up appointment with Primary Care Physician, Dr. Lynnea Ferrier to discuss psychotropic medication management with regards to unresolved symptoms of depression.  Review EMMI Educational Material on "Depression in Adults". Review "Loneliness and Isolation".        SDOH assessments and interventions completed:  Yes.    Care Coordination Interventions Activated:  Yes.   Care Coordination Interventions:  Yes, provided.   Follow up plan: Follow up call scheduled for 09/03/2021 at 12:30 pm.  Encounter Outcome:  Pt. Visit Completed.   Danford Bad, BSW, MSW, LCSW  Licensed Restaurant manager, fast food [facility]  Mailing [place] [address] [place], [place] [address] Physical Address-[address], [place], [place] [address] Toll Free Main # [phone] Fax # [phone] Cell # [phone] [email or web address]

## 2021-09-03 ENCOUNTER — Ambulatory Visit: Payer: Self-pay | Admitting: *Deleted

## 2021-09-03 ENCOUNTER — Encounter: Payer: Self-pay | Admitting: *Deleted

## 2021-09-03 NOTE — Patient Outreach (Signed)
Care Coordination   Follow Up Visit Note   09/03/2021  Name: [name] MRN: [id number] DOB: February 23, 1950  [name] is a 71 y.o. year old female who sees Pickard, Priscille Heidelberg, MD for primary care. I spoke with [name] by phone today.  What matters to the patients health and wellness today?   Obtain Supportive Resources and Find Help in My Community.   Goals Addressed               This Visit's Progress     Obtain Supportive Resources and Find Help in My Community. (pt-stated)   On track     Care Coordination Interventions:  Cognitive Behavioral Therapy Performed. Verbalization of Feelings Encouraged. Emotional Support Provided. Active Listening/Reflection Utilized. Please contact [facility] (# [phone]), to follow up on referral for counseling and supportive services. Reviewed EMMI Educational Material on "Depression in Adults". Reviewed "Loneliness and Isolation".        SDOH assessments and interventions completed:  No.     Care Coordination Interventions Activated:  Yes.   Care Coordination Interventions:  Yes, provided.   Follow up plan: Follow up call scheduled for 09/23/2021 at 09/23/2021 at 10:45 am.  Encounter Outcome:  Pt. Visit Completed.   Danford Bad, BSW, MSW, LCSW  Licensed Restaurant manager, fast food [facility]  Mailing [place] [address], [place], [place] [address] Physical Address-[address], [place], [place] [address] Toll Free Main # [phone] Fax # [phone] Cell # [phone] [email or web address]

## 2021-09-03 NOTE — Patient Instructions (Signed)
Visit Information  Thank you for taking time to visit with me today. Please don't hesitate to contact me if I can be of assistance to you.   Following are the goals we discussed today:   Goals Addressed               This Visit's Progress     Obtain Supportive Resources and Find Help in My Community. (pt-stated)   On track     Care Coordination Interventions:  Cognitive Behavioral Therapy Performed. Verbalization of Feelings Encouraged. Emotional Support Provided. Active Listening/Reflection Utilized. Please contact [facility] (# [phone]), to follow up on referral for counseling and supportive services. Reviewed EMMI Educational Material on "Depression in Adults". Reviewed "Loneliness and Isolation".        Our next appointment is by telephone on 09/23/2021 at 10:45 am.  Please call the care guide team at [phone] if you need to cancel or reschedule your appointment.   If you are experiencing a Mental Health or Behavioral Health Crisis or need someone to talk to, please call the Suicide and Crisis Lifeline: 988 call the [place] National Suicide Prevention Lifeline: 3175426988 or TTY: 614-345-0709 TTY (630)543-3605) to talk to a trained counselor call 1-800-273-TALK (toll free, 24 hour hotline) go to [facility] Care [address] [place] [phone]) call the [facility] Crisis Line: [phone] call 911  Patient verbalizes understanding of instructions and care plan provided today and agrees to view in MyChart. Active MyChart status and patient understanding of how to access instructions and care plan via MyChart confirmed with patient.     Telephone follow up appointment with care management team member scheduled for:  09/23/2021 at 10:45 am.  Danford Bad, BSW, MSW, LCSW  Licensed Clinical Social Worker  [facility]  Mailing  [place]. [address], [place], [place] [address] Physical Address-[address] [place], [place] [address] Toll Free Main # [phone] Fax # [phone] Cell # [phone] [email or web address]

## 2021-09-07 ENCOUNTER — Other Ambulatory Visit: Payer: Self-pay | Admitting: Family Medicine

## 2021-09-08 NOTE — Telephone Encounter (Signed)
LOV 05/14/21 Last refill 08/03/21, #30, 0 refills  Please review, thanks!

## 2021-09-16 ENCOUNTER — Other Ambulatory Visit: Payer: Self-pay | Admitting: Family Medicine

## 2021-09-17 NOTE — Telephone Encounter (Signed)
Requested medication (s) are due for refill today:yes  Requested medication (s) are on the active medication list: yes  Last refill:  09/08/21  Future visit scheduled:yes  Notes to clinic:  Unable to refill per protocol, cannot delegate. Comment on sig states pt needs to ween off of medication, routing for review.     Requested Prescriptions  Pending Prescriptions Disp Refills   HYDROcodone-acetaminophen (NORCO/VICODIN) 5-325 MG tablet [Pharmacy Med Name: HYDROCODONE/APAP 5/325MG  TAB] 10 tablet 0    Sig: TAKE 1 TABLET BY MOUTH EVERY 6 HOURS AS NEEDED.     Not Delegated - Analgesics:  Opioid Agonist Combinations Failed - 09/16/2021 10:37 AM      Failed - This refill cannot be delegated      Failed - Urine Drug Screen completed in last 360 days      Failed - Valid encounter within last 3 months    Recent Outpatient Visits           4 months ago Closed nondisplaced fracture of phalanx of right little finger, unspecified phalanx, initial encounter   [facility] Tanya Nones Priscille Heidelberg, MD   4 months ago Right wrist pain   [facility] Donita Brooks, MD   12 months ago Gastroesophageal reflux disease, unspecified whether esophagitis present   [facility] Pickard, Priscille Heidelberg, MD   1 year ago Pedal edema   [facility] Valentino Nose, NP   1 year ago Left arm pain   [facility] Pickard, Priscille Heidelberg, MD

## 2021-09-18 NOTE — Telephone Encounter (Signed)
Patient called to check the status of this refill. I told her it had been denied by Dr. Tanya Nones due to it was just filled on 09/07/2021 and that the request was too early. She states that her finger has been hurting pretty bad and she keeps falling on it. She will check and see if she picked up the prescription from 09/07/21.

## 2021-09-23 ENCOUNTER — Encounter: Payer: Self-pay | Admitting: *Deleted

## 2021-09-23 ENCOUNTER — Ambulatory Visit: Payer: Self-pay | Admitting: *Deleted

## 2021-09-23 NOTE — Patient Outreach (Signed)
Care Coordination   Follow Up Visit Note   09/23/2021  Name: [name] MRN: [id number] DOB: 05-03-1950  [name] is a 71 y.o. year old female who sees Pickard, Priscille Heidelberg, MD for primary care. I spoke with [name] by phone today.  What matters to the patients health and wellness today?  Obtain Supportive Resources and Find Help in My Community.    Goals Addressed               This Visit's Progress     Obtain Supportive Resources and Find Help in My Community. (pt-stated)   On track     Care Coordination Interventions:  Cognitive Behavioral Therapy Performed. Client-Centered Therapy Initiated. Verbalization of Feelings Encouraged. Emotional Support Provided. Active Listening/Reflection Utilized.         SDOH assessments and interventions completed:   Yes.    Care Coordination Interventions Activated:  Yes.   Care Coordination Interventions:  Yes, provided.   Follow up plan: No further intervention required.   Encounter Outcome:  Pt. Visit Completed.   Danford Bad, BSW, MSW, LCSW  Licensed Restaurant manager, fast food [facility]  Mailing [place] [address], [place], [place] [address] Physical Address[address] [place], [place] [address] Toll Free Main # [phone] Fax # [phone] Cell # [phone] [email or web address]

## 2021-09-23 NOTE — Patient Instructions (Signed)
Visit Information  Thank you for taking time to visit with me today. Please don't hesitate to contact me if I can be of assistance to you.   Following are the goals we discussed today:   Goals Addressed               This Visit's Progress     Obtain Supportive Resources and Find Help in My Community. (pt-stated)   On track     Care Coordination Interventions:  Cognitive Behavioral Therapy Performed. Client-Centered Therapy Initiated. Verbalization of Feelings Encouraged. Emotional Support Provided. Active Listening/Reflection Utilized.        Please call the care guide team at [phone] if you need to cancel or reschedule your appointment.   If you are experiencing a Mental Health or Behavioral Health Crisis or need someone to talk to, please call the Suicide and Crisis Lifeline: 988 call the [place] National Suicide Prevention Lifeline: 802-485-4126 or TTY: (636)248-4035 TTY 818-656-5913) to talk to a trained counselor call 1-800-273-TALK (toll free, 24 hour hotline) go to [facility] [address] [place] [phone]) call the [facility] Crisis Line: [phone] call 911  Patient verbalizes understanding of instructions and care plan provided today and agrees to view in MyChart. Active MyChart status and patient understanding of how to access instructions and care plan via MyChart confirmed with patient.     No further follow up required.  Danford Bad, BSW, MSW, LCSW  Licensed Restaurant manager, [employer] [facility]  Mailing [place] [address] [place], [place] [address] Physical Address[address], [place], [place] [address] Toll Free Main # [phone] Fax # [phone] Cell # [phone] [email or web address]

## 2021-09-28 ENCOUNTER — Telehealth: Payer: Self-pay | Admitting: Family Medicine

## 2021-09-28 NOTE — Telephone Encounter (Signed)
Received call from patient to request stronger dose and refill of  HYDROcodone-acetaminophen (NORCO/VICODIN) 5-325 MG tablet [[id number]]   Stated she's already out of medication; patient estimated her last dose taken some time last week but is unsure of exacy date. Patient stated she only took the medication as needed (when pain became intolerable), but hasn't experienced true relief..   Pharmacy confirmed as:  [place], [place]  [address], [place] [address]  Phone:  [phone]  Fax:  [phone]   LOV: 05/14/2021  Please advise at [phone].

## 2021-10-05 ENCOUNTER — Ambulatory Visit (HOSPITAL_COMMUNITY)
Admission: RE | Admit: 2021-10-05 | Discharge: 2021-10-05 | Disposition: A | Discharge status: Home / Self Care | Payer: Medicare Other | Source: Ambulatory Visit | Attending: Family Medicine | Admitting: Family Medicine

## 2021-10-05 ENCOUNTER — Ambulatory Visit (INDEPENDENT_AMBULATORY_CARE_PROVIDER_SITE_OTHER): Payer: Medicare Other | Admitting: Family Medicine

## 2021-10-05 VITALS — BP 142/82 | HR 78 | Ht 61.0 in | Wt 180.0 lb

## 2021-10-05 DIAGNOSIS — M79641 Pain in right hand: Secondary | ICD-10-CM | POA: Diagnosis not present

## 2021-10-05 MED ORDER — ESCITALOPRAM OXALATE 10 MG PO TABS: 10.0000 mg | ORAL_TABLET | Freq: Every day | ORAL | 3 refills | Status: DC
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
## 2021-10-06 ENCOUNTER — Other Ambulatory Visit: Payer: Self-pay

## 2021-10-06 DIAGNOSIS — M79641 Pain in right hand: Secondary | ICD-10-CM

## 2021-10-06 LAB — CBC WITH DIFFERENTIAL/PLATELET
Absolute Monocytes: 573 cells/uL (ref 200–950)
Basophils Absolute: 73 cells/uL (ref 0–200)
Basophils Relative: 1.2 %
Eosinophils Absolute: 153 cells/uL (ref 15–500)
Eosinophils Relative: 2.5 %
HCT: 45.2 % — ABNORMAL HIGH (ref 35.0–45.0)
Hemoglobin: 15 g/dL (ref 11.7–15.5)
Lymphs Abs: 1885 cells/uL (ref 850–3900)
MCH: 29.4 pg (ref 27.0–33.0)
MCHC: 33.2 g/dL (ref 32.0–36.0)
MCV: 88.6 fL (ref 80.0–100.0)
MPV: 11.3 fL (ref 7.5–12.5)
Monocytes Relative: 9.4 %
Neutro Abs: 3416 cells/uL (ref 1500–7800)
Neutrophils Relative %: 56 %
Platelets: 285 10*3/uL (ref 140–400)
RBC: 5.1 10*6/uL (ref 3.80–5.10)
RDW: 12.9 % (ref 11.0–15.0)
Total Lymphocyte: 30.9 %
WBC: 6.1 10*3/uL (ref 3.8–10.8)

## 2021-10-06 LAB — BASIC METABOLIC PANEL WITH GFR
BUN: 10 mg/dL (ref 7–25)
CO2: 27 mmol/L (ref 20–32)
Calcium: 9.8 mg/dL (ref 8.6–10.4)
Chloride: 100 mmol/L (ref 98–110)
Creat: 0.93 mg/dL (ref 0.60–1.00)
Glucose, Bld: 108 mg/dL — ABNORMAL HIGH (ref 65–99)
Potassium: 4.9 mmol/L (ref 3.5–5.3)
Sodium: 139 mmol/L (ref 135–146)
eGFR: 65 mL/min/{1.73_m2} (ref >=60)

## 2021-10-06 MED ORDER — MELOXICAM 15 MG PO TABS: 15.0000 mg | ORAL_TABLET | Freq: Every day | ORAL | 0 refills | Status: DC

## 2021-10-15 ENCOUNTER — Ambulatory Visit: Payer: Medicare Other | Admitting: Family Medicine

## 2021-10-29 ENCOUNTER — Encounter: Payer: Self-pay | Admitting: Family Medicine

## 2022-01-20 ENCOUNTER — Encounter: Payer: Self-pay | Admitting: Family Medicine

## 2022-01-20 ENCOUNTER — Ambulatory Visit (INDEPENDENT_AMBULATORY_CARE_PROVIDER_SITE_OTHER): Payer: Medicare Other | Admitting: Family Medicine

## 2022-01-20 VITALS — BP 142/68 | HR 80 | Temp 97.9°F | Ht 61.0 in | Wt 180.0 lb

## 2022-01-20 DIAGNOSIS — J069 Acute upper respiratory infection, unspecified: Secondary | ICD-10-CM | POA: Diagnosis not present

## 2022-01-20 MED ORDER — ALBUTEROL SULFATE HFA 108 (90 BASE) MCG/ACT IN AERS: 2.0000 | INHALATION_SPRAY | Freq: Four times a day (QID) | RESPIRATORY_TRACT | 0 refills | Status: DC | PRN

## 2022-01-20 NOTE — Patient Instructions (Addendum)
Your symptoms and exam findings are most consistent with a viral upper respiratory infection. These usually run their course in 5-7 days. Unfortunately, antibiotics don't work against viruses and just increase your risk of other issues such as diarrhea, yeast infections, and resistant infections.  If your symptoms last longer than 10 days and/or you start feeling worse with facial pain, high fever, cough, shortness of breath or start feeling significantly worse, please call us right away to be further evaluated.  Some things that can make you feel better are: - Increased rest - Increasing fluid with water/sugar free electrolytes - Acetaminophen as needed for fever/pain.  - Salt water gargling, chloraseptic spray and throat lozenges - OTC guaifenesin (Mucinex).  - Saline sinus flushes or a neti pot - May try Flonase for nasal congestion - Humidifying the air.  - 75 mg oral Zinc may shorten the length of illness

## 2022-01-20 NOTE — Assessment & Plan Note (Addendum)
Reassured patient that symptoms and exam findings are most consistent with a viral upper respiratory infection and explained lack of efficacy of antibiotics against viruses.  Discussed expected course and features suggestive of secondary bacterial infection.  Continue supportive care. Increase fluid intake with water or electrolyte solution like pedialyte. Encouraged acetaminophen as needed for fever/pain. Encouraged salt water gargling, chloraseptic spray and throat lozenges. Encouraged OTC guaifenesin. Encouraged saline sinus flushes and/or neti with humidified air. She has requested a refill of her albuterol as well, may use PRN 2 puffs every 6 hours for shortness of breath or wheezing. Will swab for Covid, flu, and RSV since she is currently day 2 and treat as indicated.

## 2022-01-20 NOTE — Progress Notes (Signed)
Acute Office Visit  Subjective:     Patient ID: [name], female    DOB: 07-28-1950, 72 y.o.   MRN: [id number]  Chief Complaint  Patient presents with   Acute Visit    Bad, cold, congestion, sneezing    HPI Patient is in today for 2 days of dry cough, sneeze, congestion, fatigue, sore throat, wheezing Denies shortness of breath, chest pain, chills, body aches, nausea, vomiting. No known sick exposures No home Covid test Has tried cough drops  Review of Systems  All other systems reviewed and are negative.  Past Medical History:  Diagnosis Date   Chronic back pain    Chronic pain in left shoulder    Depression    Dysphagia    GERD (gastroesophageal reflux disease)    Headache(784.0)    Hypertension    Vertigo    Past Surgical History:  Procedure Laterality Date   COLONOSCOPY     ESOPHAGEAL DILATION N/A 11/20/2014   Procedure: ESOPHAGEAL DILATION;  Surgeon: Corbin Ade, MD;  [facility];  Service: Endoscopy;  Laterality: N/A;   ESOPHAGOGASTRODUODENOSCOPY     ESOPHAGOGASTRODUODENOSCOPY N/A 11/10/2014   UVO:[id number] schatzkis ringerosive reflux esophagitis   ESOPHAGOGASTRODUODENOSCOPY N/A 11/20/2014   VQQ:[id number] ring s/p dilation/HH   TRACHEOSTOMY     TUBAL LIGATION     Current Outpatient Medications on File Prior to Visit  Medication Sig Dispense Refill   calcium carbonate (TUMS - DOSED IN MG ELEMENTAL CALCIUM) 500 MG chewable tablet Chew 1 tablet by mouth daily.     diclofenac Sodium (VOLTAREN) 1 % GEL Apply 4 g topically 4 (four) times daily. 100 g 0   escitalopram (LEXAPRO) 10 MG tablet Take 1 tablet (10 mg total) by mouth daily. 90 tablet 3   HYDROcodone-acetaminophen (NORCO/VICODIN) 5-325 MG tablet Take 1 tablet by mouth every 6 (six) hours as needed (need to wean off this medicine, fracture has healed.). 10 tablet 0   meloxicam (MOBIC) 15 MG tablet Take 1 tablet (15 mg total) by mouth daily. 30 tablet 0   metoprolol succinate  (TOPROL-XL) 50 MG 24 hr tablet TAKE (1) TABLET BY MOUTH ONCE DAILY. TAKE WITH OR IMMEDIATELY FOLLOWING A MEAL. 90 tablet 3   pantoprazole (PROTONIX) 40 MG tablet TAKE (1) TABLET BY MOUTH ONCE DAILY. 90 tablet 3   traMADol (ULTRAM) 50 MG tablet Take 1 tablet (50 mg total) by mouth at bedtime as needed. 5 tablet 0   [DISCONTINUED] escitalopram (LEXAPRO) 20 MG tablet Take 1 tablet (20 mg total) by mouth daily. (Patient not taking: Reported on 10/05/2021) 90 tablet 3   No current facility-administered medications on file prior to visit.   No Known Allergies      Objective:    BP (!) 142/68   Pulse 80   Temp 97.9 F (36.6 C) (Oral)   Ht 5\' 1"  (1.549 m)   Wt 180 lb (81.6 kg)   SpO2 97%   BMI 34.01 kg/m    Physical Exam  No results found for any visits on 01/20/22.      Assessment & Plan:   Problem List Items Addressed This Visit       Respiratory   Viral URI with cough - Primary    Reassured patient that symptoms and exam findings are most consistent with a viral upper respiratory infection and explained lack of efficacy of antibiotics against viruses.  Discussed expected course and features suggestive of secondary bacterial infection.  Continue supportive care. Increase fluid  intake with water or electrolyte solution like pedialyte. Encouraged acetaminophen as needed for fever/pain. Encouraged salt water gargling, chloraseptic spray and throat lozenges. Encouraged OTC guaifenesin. Encouraged saline sinus flushes and/or neti with humidified air. She has requested a refill of her albuterol as well, may use PRN 2 puffs every 6 hours for shortness of breath or wheezing. Will swab for Covid, flu, and RSV since she is currently day 2 and treat as indicated.       Relevant Orders   SARS-CoV-2 RNA, Influenza A/B, and RSV RNA, Qualitative NAAT    Meds ordered this encounter  Medications   albuterol (VENTOLIN HFA) 108 (90 Base) MCG/ACT inhaler    Sig: Inhale 2 puffs into the lungs  every 6 (six) hours as needed for wheezing or shortness of breath.    Dispense:  1 each    Refill:  0    Order Specific Question:   Supervising Provider    Answer:   Lynnea Ferrier T [[id number]]    Return if symptoms worsen or fail to improve.  Park Meo, FNP

## 2022-01-23 LAB — SARS-COV-2 RNA, INFLUENZA A/B, AND RSV RNA, QUALITATIVE NAAT
INFLUENZA A RNA: NOT DETECTED
INFLUENZA B RNA: NOT DETECTED
RSV RNA: NOT DETECTED
SARS COV2 RNA: NOT DETECTED

## 2022-02-18 ENCOUNTER — Other Ambulatory Visit: Payer: Self-pay | Admitting: Family Medicine

## 2022-02-18 DIAGNOSIS — I10 Essential (primary) hypertension: Secondary | ICD-10-CM

## 2022-03-20 ENCOUNTER — Emergency Department (HOSPITAL_COMMUNITY): Payer: Medicare Other

## 2022-03-20 ENCOUNTER — Emergency Department (HOSPITAL_COMMUNITY)
Admission: EM | Admit: 2022-03-20 | Discharge: 2022-03-20 | Disposition: A | Discharge status: Home / Self Care | Payer: Medicare Other | Attending: Emergency Medicine | Admitting: Emergency Medicine

## 2022-03-20 ENCOUNTER — Encounter (HOSPITAL_COMMUNITY): Payer: Self-pay | Admitting: Emergency Medicine

## 2022-03-20 ENCOUNTER — Other Ambulatory Visit: Payer: Self-pay

## 2022-03-20 DIAGNOSIS — S92514A Nondisplaced fracture of proximal phalanx of right lesser toe(s), initial encounter for closed fracture: Secondary | ICD-10-CM | POA: Diagnosis not present

## 2022-03-20 DIAGNOSIS — S92504A Nondisplaced unspecified fracture of right lesser toe(s), initial encounter for closed fracture: Secondary | ICD-10-CM

## 2022-03-20 DIAGNOSIS — S99921A Unspecified injury of right foot, initial encounter: Secondary | ICD-10-CM | POA: Diagnosis present

## 2022-03-20 DIAGNOSIS — W010XXA Fall on same level from slipping, tripping and stumbling without subsequent striking against object, initial encounter: Secondary | ICD-10-CM | POA: Insufficient documentation

## 2022-03-20 MED ORDER — NAPROXEN 500 MG PO TABS: 500.0000 mg | ORAL_TABLET | Freq: Two times a day (BID) | ORAL | 0 refills | Status: DC

## 2022-03-20 NOTE — Discharge Instructions (Addendum)
Your testing shows a possible fracture at the bone of your toe.  Please continue to wear the walking shoe, you may take Tylenol or ibuprofen as needed for pain, if you want something stronger I have sent high-dose naproxen to the pharmacy which you can take twice a day  Please take Naprosyn, '500mg'$  by mouth twice daily as needed for pain - this in an antiinflammatory medicine (NSAID) and is similar to ibuprofen - many people feel that it is stronger than ibuprofen and it is easier to take since it is a smaller pill.  Please use this only for 1 week - if your pain persists, you will need to follow up with your doctor in the office for ongoing guidance and pain control.  I would like for you to see the orthopedic doctor in follow-up this week.  Please call to make an appointment within the next 24 hours, you should be seen within the first few days or even the next week.  Emergency department for severe worsening symptoms

## 2022-03-20 NOTE — ED Provider Notes (Signed)
[facility] Provider Note   CSN: [id number] Arrival date & time: 03/20/22  1334     History  Chief Complaint  Patient presents with   [name] is a 72 y.o. female.   Fall   This patient is a 72 year old female presenting after injury to her right foot when she fell yesterday.  She states that she was walking out to the garbage cans on the gravel when she slipped and fell to the ground injuring her right foot.  She has bruising over the dorsum of the right foot, she also put out her left hand and has pain in her left tricep though the main complaint is her right foot.  She does not think she hit her head and does not think she lost consciousness.  She does not have any new neurologic symptoms, no headache no blurred vision no nausea no vomiting no seizures    Home Medications Prior to Admission medications   Medication Sig Start Date End Date Taking? Authorizing Provider  naproxen (NAPROSYN) 500 MG tablet Take 1 tablet (500 mg total) by mouth 2 (two) times daily with a meal. 03/20/22  Yes Noemi Chapel, MD  albuterol (VENTOLIN HFA) 108 (90 Base) MCG/ACT inhaler Inhale 2 puffs into the lungs every 6 (six) hours as needed for wheezing or shortness of breath. 01/20/22   Rubie Maid, FNP  calcium carbonate (TUMS - DOSED IN MG ELEMENTAL CALCIUM) 500 MG chewable tablet Chew 1 tablet by mouth daily.    [provider]  diclofenac Sodium (VOLTAREN) 1 % GEL Apply 4 g topically 4 (four) times daily. 05/19/20   Eulogio Bear, NP  escitalopram (LEXAPRO) 10 MG tablet Take 1 tablet (10 mg total) by mouth daily. 10/05/21   Susy Frizzle, MD  meloxicam (MOBIC) 15 MG tablet Take 1 tablet (15 mg total) by mouth daily. 10/06/21   Susy Frizzle, MD  metoprolol succinate (TOPROL-XL) 50 MG 24 hr tablet TAKE (1) TABLET BY MOUTH ONCE DAILY. TAKE WITH OR IMMEDIATELY FOLLOWING A MEAL. 02/18/22   Susy Frizzle, MD  pantoprazole  (PROTONIX) 40 MG tablet TAKE (1) TABLET BY MOUTH ONCE DAILY. 03/04/21   Susy Frizzle, MD  escitalopram (LEXAPRO) 20 MG tablet Take 1 tablet (20 mg total) by mouth daily. Patient not taking: Reported on 10/05/2021 04/09/19   Susy Frizzle, MD      Allergies    Patient has no known allergies.    Review of Systems   Review of Systems  All other systems reviewed and are negative.   Physical Exam Updated Vital Signs BP (S) (!) 189/72 (BP Location: Right Arm)   Pulse 79   Temp 98.4 F (36.9 C) (Oral)   Resp 20   Ht 1.562 m (5' 1.5")   Wt 82.6 kg   SpO2 97%   BMI 33.83 kg/m  Physical Exam Vitals and nursing note reviewed.  Constitutional:      General: She is not in acute distress.    Appearance: She is well-developed.  HENT:     Head: Normocephalic and atraumatic.     Mouth/Throat:     Pharynx: No oropharyngeal exudate.  Eyes:     General: No scleral icterus.       Right eye: No discharge.        Left eye: No discharge.     Conjunctiva/sclera: Conjunctivae normal.     Pupils: Pupils are equal,  round, and reactive to light.  Neck:     Thyroid: No thyromegaly.     Vascular: No JVD.  Cardiovascular:     Rate and Rhythm: Normal rate and regular rhythm.     Heart sounds: Normal heart sounds. No murmur heard.    No friction rub. No gallop.  Pulmonary:     Effort: Pulmonary effort is normal. No respiratory distress.     Breath sounds: Normal breath sounds. No wheezing or rales.  Abdominal:     General: Bowel sounds are normal. There is no distension.     Palpations: Abdomen is soft. There is no mass.     Tenderness: There is no abdominal tenderness.  Musculoskeletal:        General: Tenderness present. Normal range of motion.     Cervical back: Normal range of motion and neck supple.     Comments: Tenderness over the left mid tricep but totally normal range of motion, normal extensor and flexor function at the elbow, normal range of motion of the elbow without pain,  slight abrasion to the left palm, right foot with tenderness and bruising over the distal dorsal foot and singular toe without obvious lacerations  Lymphadenopathy:     Cervical: No cervical adenopathy.  Skin:    General: Skin is warm and dry.     Findings: Bruising present. No erythema or rash.  Neurological:     Mental Status: She is alert.     Coordination: Coordination normal.  Psychiatric:        Behavior: Behavior normal.     ED Results / Procedures / Treatments   Labs (all labs ordered are listed, but only abnormal results are displayed) Labs Reviewed - No data to display  EKG None  Radiology DG Foot Complete Right  Result Date: 03/20/2022 CLINICAL DATA:  RIGHT foot pain.  Slipped on gravel yesterday. EXAM: RIGHT FOOT COMPLETE - 3+ VIEW COMPARISON:  None Available. FINDINGS: There is an acute fracture of the second proximal phalanx associated with soft tissue swelling. Small plantar and Achilles calcaneal spurs are present. IMPRESSION: Acute fracture of the second proximal phalanx. Electronically Signed   By: Nolon Nations M.D.   On: 03/20/2022 14:40    Procedures Procedures    Medications Ordered in ED Medications - No data to display  ED Course/ Medical Decision Making/ A&P                             Medical Decision Making Amount and/or Complexity of Data Reviewed Radiology: ordered.  Risk Prescription drug management.   No signs of head trauma, neurologically intact, has signs of trauma to the right foot and will need x-ray.  Patient agreeable to the plan.  Vital signs are significant for hypertension but no tachycardia.  Patient agreeable.  Imaging: I personally viewed and interpreted the x-ray of the foot, there does appear to be a fracture through the phalanx of the second toe.  I agree with radiology interpretation  Patient placed back in her postop shoe, set up with anti-inflammatory for home, referred to orthopedics, patient agreeable to the  plan        Final Clinical Impression(s) / ED Diagnoses Final diagnoses:  Closed nondisplaced fracture of phalanx of lesser toe of right foot, unspecified phalanx, initial encounter    Rx / DC Orders ED Discharge Orders          Ordered    naproxen (NAPROSYN)  500 MG tablet  2 times daily with meals        03/20/22 1438              Noemi Chapel, MD 03/20/22 [id number]

## 2022-03-20 NOTE — ED Triage Notes (Signed)
Patient c/o right foot pain, headache, and left arm pain after slipping on gravel yesterday while checking mail box. Patient denies LOC but does believe she hit her head. Denies any blurred vision, dizziness, or nausea. Denies taking any type of anticoagulant. Patient has ortho shoe to right foot. Patient ambulated to wheel chair in lobby.

## 2022-03-22 ENCOUNTER — Telehealth: Payer: Self-pay

## 2022-03-22 NOTE — Transitions of Care (Post Inpatient/ED Visit) (Unsigned)
03/22/2022  Name: [name] MRN: [id number] DOB: 02-07-1950  Today's TOC FU Call Status: Today's TOC FU Call Status:: Unsuccessul Call (1st Attempt) Unsuccessful Call (1st Attempt) Date: 03/22/22  Attempted to reach the patient regarding the most recent Inpatient/ED visit.  Follow Up Plan: Additional outreach attempts will be made to reach the patient to complete the Transitions of Care (Post Inpatient/ED visit) call.   Signature  Woodfin Ganja LPN [facility] Nurse Health Advisor Direct Dial [phone]

## 2022-03-23 ENCOUNTER — Ambulatory Visit: Payer: Medicare Other | Admitting: Orthopaedic Surgery

## 2022-03-23 ENCOUNTER — Ambulatory Visit: Payer: Self-pay

## 2022-03-23 NOTE — Chronic Care Management (AMB) (Signed)
03/23/2022  [name] 1950-04-24 [id number]   Reason for Encounter: Change in CCM enrollment status    Katha Cabal RN Care Manager/Chronic Care Management [phone]

## 2022-03-23 NOTE — Transitions of Care (Post Inpatient/ED Visit) (Signed)
03/23/2022  Name: [name] MRN: [id number] DOB: Mar 12, 1950  Today's TOC FU Call Status: Today's TOC FU Call Status:: Successful TOC FU Call Competed Unsuccessful Call (1st Attempt) Date: 03/22/22 [facility] FU Call Complete Date: 03/23/22  Transition Care Management Follow-up Telephone Call Date of Discharge: 03/20/22 [facility] Type of Discharge: Emergency Department Reason for ED Visit: Other: (Closed nondisplaced fracture of phalanx of lesser toe of right foot,) How have you been since you were released from the hospital?: Better Any questions or concerns?: No  Items Reviewed: Did you receive and understand the discharge instructions provided?: Yes Medications obtained and verified?: Yes (Medications Reviewed) Any new allergies since your discharge?: No Dietary orders reviewed?: Yes Do you have support at home?: Yes  Home Care and Equipment/Supplies: Were Home Health Services Ordered?: No Any new equipment or medical supplies ordered?: No  Functional Questionnaire: Do you need assistance with bathing/showering or dressing?: No Do you need assistance with meal preparation?: No Do you need assistance with eating?: No Do you have difficulty maintaining continence: No Do you need assistance with getting out of bed/getting out of a chair/moving?: No Do you have difficulty managing or taking your medications?: No  Folllow up appointments reviewed: PCP Follow-up appointment confirmed?: No (Specialist) MD Provider Line Number:[phone] Given: Yes Specialist Hospital Follow-up appointment confirmed?: Yes Date of Specialist follow-up appointment?: 03/23/22 Follow-Up Specialty Provider:: Dr Hilda Lias Do you need transportation to your follow-up appointment?: No Do you understand care options if your condition(s) worsen?: Yes-patient verbalized understanding    SIGNATURE  Woodfin Ganja LPN [facility] Nurse Health Advisor Direct Dial [phone]

## 2022-03-30 ENCOUNTER — Ambulatory Visit: Payer: Medicare Other | Admitting: Orthopaedic Surgery

## 2022-04-20 ENCOUNTER — Other Ambulatory Visit: Payer: Self-pay

## 2022-04-20 ENCOUNTER — Telehealth: Payer: Self-pay

## 2022-04-20 DIAGNOSIS — I10 Essential (primary) hypertension: Secondary | ICD-10-CM

## 2022-04-20 MED ORDER — METOPROLOL SUCCINATE ER 50 MG PO TB24: ORAL_TABLET | ORAL | 0 refills | Status: DC

## 2022-04-20 NOTE — Telephone Encounter (Signed)
Pt called in to let nurse know that she has changed her pharmacy to the [facility] on [address] in [place]. Please advise.  Cb#: [phone]

## 2022-04-27 ENCOUNTER — Other Ambulatory Visit: Payer: Self-pay | Admitting: Family Medicine

## 2022-04-27 DIAGNOSIS — I10 Essential (primary) hypertension: Secondary | ICD-10-CM

## 2022-04-28 ENCOUNTER — Telehealth: Payer: Self-pay

## 2022-04-28 MED ORDER — NAPROXEN 500 MG PO TABS: 500.0000 mg | ORAL_TABLET | Freq: Two times a day (BID) | ORAL | 0 refills | Status: DC

## 2022-04-28 NOTE — Telephone Encounter (Signed)
Prescription Request  04/28/2022  LOV: 10/05/21  What is the name of the medication or equipment? naproxen (NAPROSYN) 500 MG  Have you contacted your pharmacy to request a refill? Yes   Which pharmacy would you like this sent to?  [id number] - [place], [place] - [address] AT [facility] [address] [place] [address] Phone: [phone] Fax: [phone]    Patient notified that their request is being sent to the clinical staff for review and that they should receive a response within 2 business days.   Please advise at [facility] [phone]

## 2022-06-01 ENCOUNTER — Ambulatory Visit: Payer: Medicare Other | Admitting: Family Medicine

## 2022-07-08 ENCOUNTER — Other Ambulatory Visit: Payer: Self-pay | Admitting: Family Medicine

## 2022-07-09 NOTE — Telephone Encounter (Signed)
Requested medication (s) are due for refill today: Yes  Requested medication (s) are on the active medication list: Yes  Last refill:  04/28/22  Future visit scheduled: No  Notes to clinic:  Manual review.    Requested Prescriptions  Pending Prescriptions Disp Refills   naproxen (NAPROSYN) 500 MG tablet [Pharmacy Med Name: NAPROXEN 500 MG TABLET] 30 tablet 0    Sig: TAKE 1 TABLET BY MOUTH 2 TIMES DAILY WITH A MEAL.     Analgesics:  NSAIDS Failed - 07/08/2022  3:49 PM      Failed - Manual Review: Labs are only required if the patient has taken medication for more than 8 weeks.      Failed - HCT in normal range and within 360 days    HCT  Date Value Ref Range Status  10/05/2021 45.2 (H) 35.0 - 45.0 % Final         Failed - Valid encounter within last 12 months    Recent Outpatient Visits           1 year ago Closed nondisplaced fracture of phalanx of right little finger, unspecified phalanx, initial encounter   [facility] Pickard, Priscille Heidelberg, MD   1 year ago Right wrist pain   [facility] Donita Brooks, MD   1 year ago Gastroesophageal reflux disease, unspecified whether esophagitis present   [facility] Donita Brooks, MD   1 year ago Pedal edema   [facility] Valentino Nose, NP   2 years ago Left arm pain   [facility] Pickard, Priscille Heidelberg, MD              Passed - Cr in normal range and within 360 days    Creat  Date Value Ref Range Status  10/05/2021 0.93 0.60 - 1.00 mg/dL Final         Passed - HGB in normal range and within 360 days    Hemoglobin  Date Value Ref Range Status  10/05/2021 15.0 11.7 - 15.5 g/dL Final         Passed - PLT in normal range and within 360 days    Platelets  Date Value Ref Range Status  10/05/2021 285 140 - 400 Thousand/uL Final         Passed - eGFR is 30 or above and within 360 days    GFR, Est African American  Date Value Ref  Range Status  07/27/2019 79 > OR = 60 mL/min/1.34m2 Final   GFR, Est Non African American  Date Value Ref Range Status  07/27/2019 68 > OR = 60 mL/min/1.63m2 Final   eGFR  Date Value Ref Range Status  10/05/2021 65 > OR = 60 mL/min/1.59m2 Final         Passed - Patient is not pregnant

## 2022-07-11 ENCOUNTER — Other Ambulatory Visit: Payer: Self-pay | Admitting: Family Medicine

## 2022-07-11 DIAGNOSIS — I10 Essential (primary) hypertension: Secondary | ICD-10-CM

## 2022-07-27 ENCOUNTER — Telehealth: Payer: Self-pay | Admitting: Family Medicine

## 2022-07-29 ENCOUNTER — Ambulatory Visit (INDEPENDENT_AMBULATORY_CARE_PROVIDER_SITE_OTHER): Payer: Medicare HMO

## 2022-07-29 VITALS — Ht 61.5 in | Wt 182.0 lb

## 2022-07-29 DIAGNOSIS — Z0000 Encounter for general adult medical examination without abnormal findings: Secondary | ICD-10-CM

## 2022-07-29 NOTE — Progress Notes (Signed)
Subjective:   [name] is a 72 y.o. female who presents for Medicare Annual (Subsequent) preventive examination.  Visit Complete: Virtual  I connected with  [name] on 07/29/22 by a audio enabled telemedicine application and verified that I am speaking with the correct person using two identifiers.  Patient Location: Home  Provider Location: Home Office  I discussed the limitations of evaluation and management by telemedicine. The patient expressed understanding and agreed to proceed.  Review of Systems     Cardiac Risk Factors include: advanced age (>20men, >64 women);dyslipidemia;hypertension  Per patient no change in vitals since last visit, unable to obtain new vitals due to telehealth visit     Objective:    Today's Vitals   07/29/22 1542  Weight: 182 lb (82.6 kg)  Height: 5' 1.5" (1.562 m)   Body mass index is 33.83 kg/m.     07/29/2022    3:46 PM 03/20/2022    1:45 PM 08/03/2021    1:20 PM 07/09/2021    3:51 PM 04/23/2017   11:01 AM 05/06/2015   12:01 PM 11/20/2014    9:18 AM  Advanced Directives  Does Patient Have a Medical Advance Directive? No No No No No No No  Would patient like information on creating a medical advance directive? Yes (MAU/Ambulatory/Procedural Areas - Information given)  No - Patient declined No - Patient declined No - Patient declined  No - patient declined information    Current Medications (verified) Outpatient Encounter Medications as of 07/29/2022  Medication Sig   albuterol (VENTOLIN HFA) 108 (90 Base) MCG/ACT inhaler Inhale 2 puffs into the lungs every 6 (six) hours as needed for wheezing or shortness of breath.   calcium carbonate (TUMS - DOSED IN MG ELEMENTAL CALCIUM) 500 MG chewable tablet Chew 1 tablet by mouth daily.   diclofenac Sodium (VOLTAREN) 1 % GEL Apply 4 g topically 4 (four) times daily.   escitalopram (LEXAPRO) 10 MG tablet Take 1 tablet (10 mg total) by mouth daily.   meloxicam (MOBIC) 15 MG tablet Take  1 tablet (15 mg total) by mouth daily.   metoprolol succinate (TOPROL-XL) 50 MG 24 hr tablet TAKE 1 TABLET BY MOUTH WITH OR IMMEDIATELY FOLLOWING A MEAL   naproxen (NAPROSYN) 500 MG tablet TAKE 1 TABLET BY MOUTH 2 TIMES DAILY WITH A MEAL.   pantoprazole (PROTONIX) 40 MG tablet TAKE (1) TABLET BY MOUTH ONCE DAILY.   [DISCONTINUED] escitalopram (LEXAPRO) 20 MG tablet Take 1 tablet (20 mg total) by mouth daily. (Patient not taking: Reported on 10/05/2021)   No facility-administered encounter medications on file as of 07/29/2022.    Allergies (verified) Patient has no known allergies.   History: Past Medical History:  Diagnosis Date   Chronic back pain    Chronic pain in left shoulder    Depression    Dysphagia    GERD (gastroesophageal reflux disease)    Headache(784.0)    Hypertension    Vertigo    Past Surgical History:  Procedure Laterality Date   COLONOSCOPY     ESOPHAGEAL DILATION N/A 11/20/2014   Procedure: ESOPHAGEAL DILATION;  Surgeon: Corbin Ade, MD;  Location: [facility];  Service: Endoscopy;  Laterality: N/A;   ESOPHAGOGASTRODUODENOSCOPY     ESOPHAGOGASTRODUODENOSCOPY N/A 11/10/2014   NUU:[id number] schatzkis ringerosive reflux esophagitis   ESOPHAGOGASTRODUODENOSCOPY N/A 11/20/2014   HKV:[id number]'V ring s/p dilation/HH   TRACHEOSTOMY     TUBAL LIGATION     History reviewed. No pertinent family history. Social History  Socioeconomic History   Marital status: Married    Spouse name: Keshara Kiger   Number of children: Not on file   Years of education: 12   Highest education level: 12th grade  Occupational History   Not on file  Tobacco Use   Smoking status: Never    Passive exposure: Never   Smokeless tobacco: Never  Vaping Use   Vaping status: Never Used  Substance and Sexual Activity   Alcohol use: No   Drug use: Never   Sexual activity: Not Currently    Birth control/protection: None  Other Topics Concern   Not on file  Social History  Narrative   Not on file   Social Determinants of Health   Financial Resource Strain: Medium Risk (08/03/2021)   Overall Financial Resource Strain (CARDIA)    Difficulty of Paying Living Expenses: Somewhat hard  Food Insecurity: No Food Insecurity (07/29/2022)   Hunger Vital Sign    Worried About Running Out of Food in the Last Year: Never true    Ran Out of Food in the Last Year: Never true  Transportation Needs: No Transportation Needs (07/29/2022)   PRAPARE - Administrator, Civil Service (Medical): No    Lack of Transportation (Non-Medical): No  Physical Activity: Inactive (07/29/2022)   Exercise Vital Sign    Days of Exercise per Week: 0 days    Minutes of Exercise per Session: 0 min  Stress: No Stress Concern Present (07/29/2022)   [employer] of Occupational Health - Occupational Stress Questionnaire    Feeling of Stress : Only a little  Social Connections: Moderately Isolated (07/29/2022)   Social Connection and Isolation Panel [NHANES]    Frequency of Communication with Friends and Family: More than three times a week    Frequency of Social Gatherings with Friends and Family: Twice a week    Attends Religious Services: Never    Database administrator or Organizations: No    Attends Engineer, structural: Never    Marital Status: Married    Tobacco Counseling Counseling given: Not Answered   Clinical Intake:  Pre-visit preparation completed: Yes  Pain : No/denies pain     Diabetes: No  How often do you need to have someone help you when you read instructions, pamphlets, or other written materials from your doctor or pharmacy?: 1 - Never  Interpreter Needed?: No  Information entered by :: Kandis Fantasia LPN   Activities of Daily Living    07/29/2022    3:46 PM 08/03/2021    1:17 PM  In your present state of health, do you have any difficulty performing the following activities:  Hearing? 0 0  Vision? 0 0  Difficulty concentrating or  making decisions? 0 0  Walking or climbing stairs? 0 1  Comment  Unsteady Gait/Balance  Dressing or bathing? 0 0  Doing errands, shopping? 0 0  Preparing Food and eating ? N N  Using the Toilet? N N  In the past six months, have you accidently leaked urine? N Y  Comment  Infrequent Urinary Incontinence  Do you have problems with loss of bowel control? N N  Managing your Medications? N N  Managing your Finances? N N  Housekeeping or managing your Housekeeping? N N    Patient Care Team: Donita Brooks, MD as PCP - General (Family Medicine) Jena Gauss Gerrit Friends, MD as Consulting Physician (Gastroenterology)  Indicate any recent Medical Services you may have received from other than [facility]  providers in the past year (date may be approximate).     Assessment:   This is a routine wellness examination for Maryana.  Hearing/Vision screen Hearing Screening - Comments:: Denies hearing difficulties   Vision Screening - Comments:: No vision problems; will schedule routine eye exam soon    Dietary issues and exercise activities discussed:     Goals Addressed               This Visit's Progress     COMPLETED: Obtain Supportive Resources and Find Help in My Community. (pt-stated)        Care Coordination Interventions:  Cognitive Behavioral Therapy Performed. Client-Centered Therapy Initiated. Verbalization of Feelings Encouraged. Emotional Support Provided. Active Listening/Reflection Utilized.       Depression Screen    07/29/2022    3:43 PM 10/05/2021   10:21 AM 08/03/2021    1:08 PM 07/09/2021    3:43 PM 04/09/2019   12:46 PM 08/03/2018    3:54 PM 03/25/2017   10:54 AM  PHQ 2/9 Scores  PHQ - 2 Score 6 6 2 4 4 3 6   PHQ- 9 Score 11 26 2 5 11 13 19     Fall Risk    07/29/2022    3:45 PM 08/03/2021    1:17 PM 07/09/2021    3:51 PM 05/19/2020    3:05 PM 08/03/2018    3:53 PM  Fall Risk   Falls in the past year? 1 1 1 1 1   Number falls in past yr: 0 0 0 1 0  Injury with  Fall? 1 1 1 1  0  Risk for fall due to : History of fall(s) History of fall(s);Impaired balance/gait;Impaired mobility History of fall(s);Impaired balance/gait  Impaired balance/gait  Follow up Education provided;Falls prevention discussed;Falls evaluation completed Falls evaluation completed;Education provided;Falls prevention discussed Falls prevention discussed  Falls evaluation completed    MEDICARE RISK AT HOME:  Medicare Risk at Home - 07/29/22 1545     Any stairs in or around the home? No    If so, are there any without handrails? No    Home free of loose throw rugs in walkways, pet beds, electrical cords, etc? Yes    Adequate lighting in your home to reduce risk of falls? Yes    Life alert? No    Use of a cane, walker or w/c? No    Grab bars in the bathroom? No    Shower chair or bench in shower? No    Elevated toilet seat or a handicapped toilet? No             TIMED UP AND GO:  Was the test performed?  No    Cognitive Function:        07/29/2022    3:46 PM 07/09/2021    3:55 PM  6CIT Screen  What Year? 0 points 0 points  What month? 0 points 0 points  What time? 0 points 0 points  Count back from 20 0 points 0 points  Months in reverse 2 points 4 points  Repeat phrase 2 points 2 points  Total Score 4 points 6 points    Immunizations Immunization History  Administered Date(s) Administered   Influenza,inj,Quad PF,6+ Mos 11/05/2016   Influenza-Unspecified 10/12/2014   Moderna Sars-Covid-2 Vaccination 03/29/2019, 04/27/2019, 12/13/2019    TDAP status: Due, Education has been provided regarding the importance of this vaccine. Advised may receive this vaccine at local pharmacy or Health Dept. Aware to provide a copy of the vaccination  record if obtained from local pharmacy or Health Dept. Verbalized acceptance and understanding.  Pneumococcal vaccine status: Declined,  Education has been provided regarding the importance of this vaccine but patient still  declined. Advised may receive this vaccine at local pharmacy or Health Dept. Aware to provide a copy of the vaccination record if obtained from local pharmacy or Health Dept. Verbalized acceptance and understanding.   Covid-19 vaccine status: Declined, Education has been provided regarding the importance of this vaccine but patient still declined. Advised may receive this vaccine at local pharmacy or Health Dept.or vaccine clinic. Aware to provide a copy of the vaccination record if obtained from local pharmacy or Health Dept. Verbalized acceptance and understanding.  Qualifies for Shingles Vaccine? Yes   Zostavax completed No   Shingrix Completed?: No.    Education has been provided regarding the importance of this vaccine. Patient has been advised to call insurance company to determine out of pocket expense if they have not yet received this vaccine. Advised may also receive vaccine at local pharmacy or Health Dept. Verbalized acceptance and understanding.  Screening Tests Health Maintenance  Topic Date Due   Hepatitis C Screening  Never done   DTaP/Tdap/Td (1 - Tdap) Never done   Zoster Vaccines- Shingrix (1 of 2) Never done   MAMMOGRAM  01/12/2012   Pneumonia Vaccine 24+ Years old (1 of 1 - PCV) Never done   DEXA SCAN  Never done   Colonoscopy  01/12/2020   COVID-19 Vaccine (4 - 2023-24 season) 09/11/2021   INFLUENZA VACCINE  08/12/2022   Medicare Annual Wellness (AWV)  07/29/2023   HPV VACCINES  Aged Out    Health Maintenance  Health Maintenance Due  Topic Date Due   Hepatitis C Screening  Never done   DTaP/Tdap/Td (1 - Tdap) Never done   Zoster Vaccines- Shingrix (1 of 2) Never done   MAMMOGRAM  01/12/2012   Pneumonia Vaccine 13+ Years old (1 of 1 - PCV) Never done   DEXA SCAN  Never done   Colonoscopy  01/12/2020   COVID-19 Vaccine (4 - 2023-24 season) 09/11/2021    Colorectal cancer screening:  Patient declines at this time  Mammogram status:  Patient declines at this  time  Bone density status:  Patient declines at this time   Lung Cancer Screening: (Low Dose CT Chest recommended if Age 27-80 years, 20 pack-year currently smoking OR have quit w/in 15years.) does not qualify.   Lung Cancer Screening Referral: n/a  Additional Screening:  Hepatitis C Screening: does qualify  Vision Screening: Recommended annual ophthalmology exams for early detection of glaucoma and other disorders of the eye. Is the patient up to date with their annual eye exam?  No  Who is the provider or what is the name of the office in which the patient attends annual eye exams? none If pt is not established with a provider, would they like to be referred to a provider to establish care? No .   Dental Screening: Recommended annual dental exams for proper oral hygiene  Community Resource Referral / Chronic Care Management: CRR required this visit?  No   CCM required this visit?  No     Plan:     I have personally reviewed and noted the following in the patient's chart:   Medical and social history Use of alcohol, tobacco or illicit drugs  Current medications and supplements including opioid prescriptions. Patient is not currently taking opioid prescriptions. Functional ability and status Nutritional status Physical  activity Advanced directives List of other physicians Hospitalizations, surgeries, and ER visits in previous 12 months Vitals Screenings to include cognitive, depression, and falls Referrals and appointments  In addition, I have reviewed and discussed with patient certain preventive protocols, quality metrics, and best practice recommendations. A written personalized care plan for preventive services as well as general preventive health recommendations were provided to patient.     Kandis Fantasia [place], [place]   1/61/0960   After Visit Summary: (Pick Up) Due to this being a telephonic visit, with patients personalized plan was offered to patient and  patient has requested to Pick up at office.  Nurse Notes: Patient with upcoming appointment for chronic fatigue

## 2022-07-29 NOTE — Patient Instructions (Addendum)
[name] , Thank you for taking time to come for your Medicare Wellness Visit. I appreciate your ongoing commitment to your health goals. Please review the following plan we discussed and let me know if I can assist you in the future.   These are the goals we discussed:  Goals      Exercise 3x per week (30 min per time)     Encouraged chair exercises.         This is a list of the screening recommended for you and due dates:  Health Maintenance  Topic Date Due   Hepatitis C Screening  Never done   DTaP/Tdap/Td vaccine (1 - Tdap) Never done   Zoster (Shingles) Vaccine (1 of 2) Never done   Mammogram  01/12/2012   Pneumonia Vaccine (1 of 1 - PCV) Never done   DEXA scan (bone density measurement)  Never done   Colon Cancer Screening  01/12/2020   COVID-19 Vaccine (4 - 2023-24 season) 09/11/2021   Flu Shot  08/12/2022   Medicare Annual Wellness Visit  07/29/2023   HPV Vaccine  Aged Out    Advanced directives: Information on Advanced Care Planning can be found at [facility] of [facility] Advance Health Care Directives Advance Health Care Directives ([email or web address] Please bring a copy of your health care power of attorney and living will to the office to be added to your chart at your convenience.  Conditions/risks identified: Aim for 30 minutes of exercise or brisk walking, 6-8 glasses of water, and 5 servings of fruits and vegetables each day.  Next appointment: Follow up in one year for your annual wellness visit    Preventive Care 65 Years and Older, Female Preventive care refers to lifestyle choices and visits with your health care provider that can promote health and wellness. What does preventive care include? A yearly physical exam. This is also called an annual well check. Dental exams once or twice a year. Routine eye exams. Ask your health care provider how often you should have your eyes checked. Personal lifestyle choices, including: Daily care of your teeth  and gums. Regular physical activity. Eating a healthy diet. Avoiding tobacco and drug use. Limiting alcohol use. Practicing safe sex. Taking low-dose aspirin every day. Taking vitamin and mineral supplements as recommended by your health care provider. What happens during an annual well check? The services and screenings done by your health care provider during your annual well check will depend on your age, overall health, lifestyle risk factors, and family history of disease. Counseling  Your health care provider may ask you questions about your: Alcohol use. Tobacco use. Drug use. Emotional well-being. Home and relationship well-being. Sexual activity. Eating habits. History of falls. Memory and ability to understand (cognition). Work and work Astronomer. Reproductive health. Screening  You may have the following tests or measurements: Height, weight, and BMI. Blood pressure. Lipid and cholesterol levels. These may be checked every 5 years, or more frequently if you are over 81 years old. Skin check. Lung cancer screening. You may have this screening every year starting at age 41 if you have a 30-pack-year history of smoking and currently smoke or have quit within the past 15 years. Fecal occult blood test (FOBT) of the stool. You may have this test every year starting at age 84. Flexible sigmoidoscopy or colonoscopy. You may have a sigmoidoscopy every 5 years or a colonoscopy every 10 years starting at age 26. Hepatitis C blood test. Hepatitis B blood test.  Sexually transmitted disease (STD) testing. Diabetes screening. This is done by checking your blood sugar (glucose) after you have not eaten for a while (fasting). You may have this done every 1-3 years. Bone density scan. This is done to screen for osteoporosis. You may have this done starting at age 47. Mammogram. This may be done every 1-2 years. Talk to your health care provider about how often you should have regular  mammograms. Talk with your health care provider about your test results, treatment options, and if necessary, the need for more tests. Vaccines  Your health care provider may recommend certain vaccines, such as: Influenza vaccine. This is recommended every year. Tetanus, diphtheria, and acellular pertussis (Tdap, Td) vaccine. You may need a Td booster every 10 years. Zoster vaccine. You may need this after age 38. Pneumococcal 13-valent conjugate (PCV13) vaccine. One dose is recommended after age 44. Pneumococcal polysaccharide (PPSV23) vaccine. One dose is recommended after age 87. Talk to your health care provider about which screenings and vaccines you need and how often you need them. This information is not intended to replace advice given to you by your health care provider. Make sure you discuss any questions you have with your health care provider. Document Released: 01/24/2015 Document Revised: 09/17/2015 Document Reviewed: 10/29/2014 Elsevier Interactive Patient Education  2017 [employer].  Fall Prevention in the Home Falls can cause injuries. They can happen to people of all ages. There are many things you can do to make your home safe and to help prevent falls. What can I do on the outside of my home? Regularly fix the edges of walkways and driveways and fix any cracks. Remove anything that might make you trip as you walk through a door, such as a raised step or threshold. Trim any bushes or trees on the path to your home. Use bright outdoor lighting. Clear any walking paths of anything that might make someone trip, such as rocks or tools. Regularly check to see if handrails are loose or broken. Make sure that both sides of any steps have handrails. Any raised decks and porches should have guardrails on the edges. Have any leaves, snow, or ice cleared regularly. Use sand or salt on walking paths during winter. Clean up any spills in your garage right away. This includes oil  or grease spills. What can I do in the bathroom? Use night lights. Install grab bars by the toilet and in the tub and shower. Do not use towel bars as grab bars. Use non-skid mats or decals in the tub or shower. If you need to sit down in the shower, use a plastic, non-slip stool. Keep the floor dry. Clean up any water that spills on the floor as soon as it happens. Remove soap buildup in the tub or shower regularly. Attach bath mats securely with double-sided non-slip rug tape. Do not have throw rugs and other things on the floor that can make you trip. What can I do in the bedroom? Use night lights. Make sure that you have a light by your bed that is easy to reach. Do not use any sheets or blankets that are too big for your bed. They should not hang down onto the floor. Have a firm chair that has side arms. You can use this for support while you get dressed. Do not have throw rugs and other things on the floor that can make you trip. What can I do in the kitchen? Clean up any spills right away.  Avoid walking on wet floors. Keep items that you use a lot in easy-to-reach places. If you need to reach something above you, use a strong step stool that has a grab bar. Keep electrical cords out of the way. Do not use floor polish or wax that makes floors slippery. If you must use wax, use non-skid floor wax. Do not have throw rugs and other things on the floor that can make you trip. What can I do with my stairs? Do not leave any items on the stairs. Make sure that there are handrails on both sides of the stairs and use them. Fix handrails that are broken or loose. Make sure that handrails are as long as the stairways. Check any carpeting to make sure that it is firmly attached to the stairs. Fix any carpet that is loose or worn. Avoid having throw rugs at the top or bottom of the stairs. If you do have throw rugs, attach them to the floor with carpet tape. Make sure that you have a light  switch at the top of the stairs and the bottom of the stairs. If you do not have them, ask someone to add them for you. What else can I do to help prevent falls? Wear shoes that: Do not have high heels. Have rubber bottoms. Are comfortable and fit you well. Are closed at the toe. Do not wear sandals. If you use a stepladder: Make sure that it is fully opened. Do not climb a closed stepladder. Make sure that both sides of the stepladder are locked into place. Ask someone to hold it for you, if possible. Clearly mark and make sure that you can see: Any grab bars or handrails. First and last steps. Where the edge of each step is. Use tools that help you move around (mobility aids) if they are needed. These include: Canes. Walkers. Scooters. Crutches. Turn on the lights when you go into a dark area. Replace any light bulbs as soon as they burn out. Set up your furniture so you have a clear path. Avoid moving your furniture around. If any of your floors are uneven, fix them. If there are any pets around you, be aware of where they are. Review your medicines with your doctor. Some medicines can make you feel dizzy. This can increase your chance of falling. Ask your doctor what other things that you can do to help prevent falls. This information is not intended to replace advice given to you by your health care provider. Make sure you discuss any questions you have with your health care provider. Document Released: 10/24/2008 Document Revised: 06/05/2015 Document Reviewed: 02/01/2014 Elsevier Interactive Patient Education  2017 [employer].

## 2022-08-02 ENCOUNTER — Encounter: Payer: Self-pay | Admitting: Family Medicine

## 2022-08-02 ENCOUNTER — Ambulatory Visit (INDEPENDENT_AMBULATORY_CARE_PROVIDER_SITE_OTHER): Payer: Medicare HMO | Admitting: Family Medicine

## 2022-08-02 VITALS — BP 122/72 | HR 74 | Temp 98.1°F | Ht 61.5 in | Wt 181.0 lb

## 2022-08-02 DIAGNOSIS — R1013 Epigastric pain: Secondary | ICD-10-CM

## 2022-08-02 DIAGNOSIS — R6881 Early satiety: Secondary | ICD-10-CM | POA: Diagnosis not present

## 2022-08-02 DIAGNOSIS — R5383 Other fatigue: Secondary | ICD-10-CM

## 2022-08-02 LAB — CBC WITH DIFFERENTIAL/PLATELET
Absolute Monocytes: 807 cells/uL (ref 200–950)
Basophils Absolute: 59 cells/uL (ref 0–200)
Basophils Relative: 0.8 %
Eosinophils Relative: 2.6 %
HCT: 45.5 % — ABNORMAL HIGH (ref 35.0–45.0)
MCH: 29.7 pg (ref 27.0–33.0)
MCHC: 32.5 g/dL (ref 32.0–36.0)
MCV: 91.2 fL (ref 80.0–100.0)
MPV: 11.2 fL (ref 7.5–12.5)
Monocytes Relative: 10.9 %
Platelets: 274 10*3/uL (ref 140–400)
RDW: 12.5 % (ref 11.0–15.0)

## 2022-08-02 MED ORDER — PANTOPRAZOLE SODIUM 40 MG PO TBEC: 40.0000 mg | DELAYED_RELEASE_TABLET | Freq: Every day | ORAL | 3 refills | Status: DC

## 2022-08-02 NOTE — Progress Notes (Signed)
Subjective:    Patient ID: [name], female    DOB: 02-15-1950, 72 y.o.   MRN: [id number]  States that he has a history of a hiatal hernia.  Has stopped PPI.  Now reports 1 week of early satiety, nausea, and epigastric tenderness with eating.  Using naprosyn on a daily basis.  Denies melena or hematochezia.  Denies CP, sob, doe.  Does report fatigue.  Denies fever, chills, night sweats.   Wt Readings from Last 3 Encounters:  08/02/22 181 lb (82.1 kg)  07/29/22 182 lb (82.6 kg)  03/20/22 182 lb (82.6 kg)    Past Medical History:  Diagnosis Date   Chronic back pain    Chronic pain in left shoulder    Depression    Dysphagia    GERD (gastroesophageal reflux disease)    Headache(784.0)    Hypertension    Vertigo    Past Surgical History:  Procedure Laterality Date   COLONOSCOPY     ESOPHAGEAL DILATION N/A 11/20/2014   Procedure: ESOPHAGEAL DILATION;  Surgeon: Corbin Ade, MD;  [facility];  Service: Endoscopy;  Laterality: N/A;   ESOPHAGOGASTRODUODENOSCOPY     ESOPHAGOGASTRODUODENOSCOPY N/A 11/10/2014   WUJ:[id number] schatzkis ringerosive reflux esophagitis   ESOPHAGOGASTRODUODENOSCOPY N/A 11/20/2014   NFA:[id number] ring s/p dilation/HH   TRACHEOSTOMY     TUBAL LIGATION     Current Outpatient Medications on File Prior to Visit  Medication Sig Dispense Refill   metoprolol succinate (TOPROL-XL) 50 MG 24 hr tablet TAKE 1 TABLET BY MOUTH WITH OR IMMEDIATELY FOLLOWING A MEAL 90 tablet 0   naproxen (NAPROSYN) 500 MG tablet TAKE 1 TABLET BY MOUTH 2 TIMES DAILY WITH A MEAL. 30 tablet 0   albuterol (VENTOLIN HFA) 108 (90 Base) MCG/ACT inhaler Inhale 2 puffs into the lungs every 6 (six) hours as needed for wheezing or shortness of breath. (Patient not taking: Reported on 08/02/2022) 1 each 0   calcium carbonate (TUMS - DOSED IN MG ELEMENTAL CALCIUM) 500 MG chewable tablet Chew 1 tablet by mouth daily. (Patient not taking: Reported on 08/02/2022)     diclofenac Sodium  (VOLTAREN) 1 % GEL Apply 4 g topically 4 (four) times daily. (Patient not taking: Reported on 08/02/2022) 100 g 0   escitalopram (LEXAPRO) 10 MG tablet Take 1 tablet (10 mg total) by mouth daily. (Patient not taking: Reported on 08/02/2022) 90 tablet 3   meloxicam (MOBIC) 15 MG tablet Take 1 tablet (15 mg total) by mouth daily. (Patient not taking: Reported on 08/02/2022) 30 tablet 0   [DISCONTINUED] escitalopram (LEXAPRO) 20 MG tablet Take 1 tablet (20 mg total) by mouth daily. (Patient not taking: Reported on 10/05/2021) 90 tablet 3   No current facility-administered medications on file prior to visit.   No Known Allergies Social History   Socioeconomic History   Marital status: Married    Spouse name: Analice Holtz   Number of children: Not on file   Years of education: 12   Highest education level: 12th grade  Occupational History   Not on file  Tobacco Use   Smoking status: Never    Passive exposure: Never   Smokeless tobacco: Never  Vaping Use   Vaping status: Never Used  Substance and Sexual Activity   Alcohol use: No   Drug use: Never   Sexual activity: Not Currently    Birth control/protection: None  Other Topics Concern   Not on file  Social History Narrative   Not on file  Social Determinants of Health   Financial Resource Strain: Medium Risk (08/03/2021)   Overall Financial Resource Strain (CARDIA)    Difficulty of Paying Living Expenses: Somewhat hard  Food Insecurity: No Food Insecurity (07/29/2022)   Hunger Vital Sign    Worried About Running Out of Food in the Last Year: Never true    Ran Out of Food in the Last Year: Never true  Transportation Needs: No Transportation Needs (07/29/2022)   PRAPARE - Administrator, Civil Service (Medical): No    Lack of Transportation (Non-Medical): No  Physical Activity: Inactive (07/29/2022)   Exercise Vital Sign    Days of Exercise per Week: 0 days    Minutes of Exercise per Session: 0 min  Stress: No Stress  Concern Present (07/29/2022)   [employer] of Occupational Health - Occupational Stress Questionnaire    Feeling of Stress : Only a little  Social Connections: Moderately Isolated (07/29/2022)   Social Connection and Isolation Panel [NHANES]    Frequency of Communication with Friends and Family: More than three times a week    Frequency of Social Gatherings with Friends and Family: Twice a week    Attends Religious Services: Never    Database administrator or Organizations: No    Attends Banker Meetings: Never    Marital Status: Married  Catering manager Violence: Not At Risk (07/29/2022)   Humiliation, Afraid, Rape, and Kick questionnaire    Fear of Current or Ex-Partner: No    Emotionally Abused: No    Physically Abused: No    Sexually Abused: No     Review of Systems  Musculoskeletal:  Positive for back pain.  All other systems reviewed and are negative.      Objective:   Physical Exam Vitals reviewed.  Constitutional:      General: She is not in acute distress.    Appearance: She is well-developed. She is obese. She is not ill-appearing or toxic-appearing.  HENT:     Mouth/Throat:     Mouth: Mucous membranes are moist.     Pharynx: No oropharyngeal exudate or posterior oropharyngeal erythema.  Neck:     Vascular: No carotid bruit.  Cardiovascular:     Rate and Rhythm: Normal rate and regular rhythm.     Pulses: Normal pulses.     Heart sounds: Normal heart sounds. No murmur heard.    No friction rub. No gallop.  Pulmonary:     Effort: Pulmonary effort is normal. No respiratory distress.     Breath sounds: Normal breath sounds. No stridor. No wheezing, rhonchi or rales.  Chest:     Chest wall: No deformity, swelling, tenderness, crepitus or edema.  Abdominal:     General: Bowel sounds are normal. There is no distension.     Palpations: Abdomen is soft. There is no mass.     Tenderness: There is abdominal tenderness. There is no guarding or  rebound.     Hernia: No hernia is present.    Musculoskeletal:     Cervical back: Neck supple.     Right lower leg: No edema.     Left lower leg: No edema.  Lymphadenopathy:     Cervical: No cervical adenopathy.  Neurological:     General: No focal deficit present.     Mental Status: She is alert and oriented to person, place, and time.     Cranial Nerves: No cranial nerve deficit.     Motor: No weakness.  Coordination: Coordination normal.           Assessment & Plan:  Fatigue, unspecified type - Plan: TSH, CBC with Differential/Platelet, COMPLETE METABOLIC PANEL WITH GFR, Vitamin B12  Epigastric pain  Early satiety   Suspect gastritis.  Hold naprosyn.  Resume pantoprazole 40 mg poqday.  Check cbc, cmp, tsh, and b12 regarding fatigue.  Reassess in 2 weeks or sooner if worse.

## 2022-08-03 LAB — COMPLETE METABOLIC PANEL WITH GFR
AG Ratio: 1.6 (calc) (ref 1.0–2.5)
ALT: 14 U/L (ref 6–29)
AST: 17 U/L (ref 10–35)
Albumin: 4.4 g/dL (ref 3.6–5.1)
Alkaline phosphatase (APISO): 66 U/L (ref 37–153)
BUN: 15 mg/dL (ref 7–25)
CO2: 27 mmol/L (ref 20–32)
Calcium: 10.3 mg/dL (ref 8.6–10.4)
Chloride: 100 mmol/L (ref 98–110)
Creat: 0.84 mg/dL (ref 0.60–1.00)
Globulin: 2.8 g/dL (calc) (ref 1.9–3.7)
Glucose, Bld: 99 mg/dL (ref 65–99)
Potassium: 4.6 mmol/L (ref 3.5–5.3)
Sodium: 137 mmol/L (ref 135–146)
Total Bilirubin: 0.4 mg/dL (ref 0.2–1.2)
Total Protein: 7.2 g/dL (ref 6.1–8.1)
eGFR: 73 mL/min/{1.73_m2} (ref >=60)

## 2022-08-03 LAB — CBC WITH DIFFERENTIAL/PLATELET
Eosinophils Absolute: 192 cells/uL (ref 15–500)
Hemoglobin: 14.8 g/dL (ref 11.7–15.5)
Lymphs Abs: 2501 cells/uL (ref 850–3900)
Neutro Abs: 3841 cells/uL (ref 1500–7800)
Neutrophils Relative %: 51.9 %
RBC: 4.99 10*6/uL (ref 3.80–5.10)
Total Lymphocyte: 33.8 %
WBC: 7.4 10*3/uL (ref 3.8–10.8)

## 2022-08-03 LAB — TSH: TSH: 0.78 mIU/L (ref 0.40–4.50)

## 2022-08-03 LAB — VITAMIN B12: Vitamin B-12: 392 pg/mL (ref 200–1100)

## 2022-08-04 ENCOUNTER — Telehealth: Payer: Self-pay

## 2022-08-04 NOTE — Telephone Encounter (Signed)
Pt came by office stating that she is concerned about taking the Protonix due to the list of possible side effects. Pt asks if there is another medication available? Thanks.

## 2022-08-05 NOTE — Telephone Encounter (Signed)
Erroneous encounter. Please disregard.

## 2022-09-03 DIAGNOSIS — Z008 Encounter for other general examination: Secondary | ICD-10-CM | POA: Diagnosis not present

## 2022-09-03 DIAGNOSIS — Z809 Family history of malignant neoplasm, unspecified: Secondary | ICD-10-CM | POA: Diagnosis not present

## 2022-09-03 DIAGNOSIS — R32 Unspecified urinary incontinence: Secondary | ICD-10-CM | POA: Diagnosis not present

## 2022-09-03 DIAGNOSIS — Z833 Family history of diabetes mellitus: Secondary | ICD-10-CM | POA: Diagnosis not present

## 2022-09-03 DIAGNOSIS — I129 Hypertensive chronic kidney disease with stage 1 through stage 4 chronic kidney disease, or unspecified chronic kidney disease: Secondary | ICD-10-CM | POA: Diagnosis not present

## 2022-09-03 DIAGNOSIS — N182 Chronic kidney disease, stage 2 (mild): Secondary | ICD-10-CM | POA: Diagnosis not present

## 2022-09-03 DIAGNOSIS — Z59819 Housing instability, housed unspecified: Secondary | ICD-10-CM | POA: Diagnosis not present

## 2022-09-03 DIAGNOSIS — Z5919 Other inadequate housing: Secondary | ICD-10-CM | POA: Diagnosis not present

## 2022-09-03 DIAGNOSIS — K219 Gastro-esophageal reflux disease without esophagitis: Secondary | ICD-10-CM | POA: Diagnosis not present

## 2022-09-03 DIAGNOSIS — Z6835 Body mass index (BMI) 35.0-35.9, adult: Secondary | ICD-10-CM | POA: Diagnosis not present

## 2022-10-12 ENCOUNTER — Other Ambulatory Visit: Payer: Self-pay | Admitting: Family Medicine

## 2022-10-12 DIAGNOSIS — I10 Essential (primary) hypertension: Secondary | ICD-10-CM

## 2022-10-12 NOTE — Telephone Encounter (Signed)
Last OV 08/02/22 Requested Prescriptions  Pending Prescriptions Disp Refills   metoprolol succinate (TOPROL-XL) 50 MG 24 hr tablet [Pharmacy Med Name: METOPROLOL SUCC ER 50 MG TAB] 90 tablet 0    Sig: TAKE 1 TABLET BY MOUTH WITH OR IMMEDIATELY FOLLOWING A MEAL     Cardiovascular:  Beta Blockers Failed - 10/12/2022  2:40 AM      Failed - Valid encounter within last 6 months    Recent Outpatient Visits           1 year ago Closed nondisplaced fracture of phalanx of right little finger, unspecified phalanx, initial encounter   [facility] Pickard, Priscille Heidelberg, MD   1 year ago Right wrist pain   [facility] Donita Brooks, MD   2 years ago Gastroesophageal reflux disease, unspecified whether esophagitis present   [facility] Donita Brooks, MD   2 years ago Pedal edema   [facility] Valentino Nose, NP   2 years ago Left arm pain   [facility] Pickard, Priscille Heidelberg, MD              Passed - Last BP in normal range    BP Readings from Last 1 Encounters:  08/02/22 122/72         Passed - Last Heart Rate in normal range    Pulse Readings from Last 1 Encounters:  08/02/22 74

## 2022-11-02 ENCOUNTER — Telehealth: Payer: Self-pay

## 2022-11-02 NOTE — Telephone Encounter (Signed)
Pt called in to ask if pcp would send in a prescription for pt to get Depends briefs for incontinence. Please advise   Cb#: [phone]

## 2022-11-09 NOTE — Telephone Encounter (Signed)
Pt called back in to let nurse know that she uses [employer] for her insurance. Please advise.  Cb#: [phone]

## 2022-11-25 ENCOUNTER — Ambulatory Visit: Payer: Self-pay | Admitting: Family Medicine

## 2022-11-25 ENCOUNTER — Telehealth: Payer: Self-pay | Admitting: Family Medicine

## 2022-11-25 NOTE — Telephone Encounter (Signed)
  Chief Complaint: Medication Request Symptoms: No reported symptoms at this time.  Frequency: N/A  Disposition: [] ED /[] Urgent Care (no appt availability in office) / [] Appointment(In office/virtual)/ []  [facility]/ [] Home Care/ [] Refused Recommended Disposition /[] [facility]/ [x]  Follow-up with PCP Additional Notes: Called patient back from earlier inquisition, states she would like a new prescription for ibuprofen 800 mg for headaches she has consistently. No appointments available within the next three days, placed on a wait list and routed information to PCP office with callback information.     Reason for Disposition  Prescription request for new medicine (not a refill)  Answer Assessment - Initial Assessment Questions 1. NAME of MEDICINE: "What medicine(s) are you calling about?"     Ibuprofen 800 mg 2. QUESTION: "What is your question?" (e.g., double dose of medicine, side effect)     Medication Request 3. PRESCRIBER: "Who prescribed the medicine?" Reason: if prescribed by specialist, call should be referred to that group.     Not prescribed yet, would like a prescription.  4. SYMPTOMS: "Do you have any symptoms?" If Yes, ask: "What symptoms are you having?"  "How bad are the symptoms (e.g., mild, moderate, severe)     Wanting a prescription for headaches that patient reports are everyday, all day.  5. PREGNANCY:  "Is there any chance that you are pregnant?" "When was your last menstrual period?"     No  Protocols used: Medication Question Call-A-AH

## 2022-11-29 ENCOUNTER — Telehealth: Payer: Self-pay

## 2022-11-29 DIAGNOSIS — M5090 Cervical disc disorder, unspecified, unspecified cervical region: Secondary | ICD-10-CM

## 2022-11-29 MED ORDER — IBUPROFEN 800 MG PO TABS: 800.0000 mg | ORAL_TABLET | Freq: Three times a day (TID) | ORAL | 1 refills | Status: DC | PRN

## 2022-11-29 NOTE — Telephone Encounter (Signed)
Pt advised to not take Rx with Mobic or Naproxen. Pt verbalized understanding of all. Mjp,lpn

## 2022-11-29 NOTE — Telephone Encounter (Signed)
Refill for Ibuprofen sent as ordered. Mjp,lpn

## 2022-12-15 ENCOUNTER — Telehealth: Payer: Self-pay

## 2022-12-15 NOTE — Telephone Encounter (Signed)
Copied from CRM [id number]. Topic: Clinical - Medication Question >> Dec 15, 2022 12:01 PM Desma Mcgregor wrote: Reason for CRM: Patient stated that she hasn't heard anything back about her Depends briefs. She did advise that she now has [employer] until the end of the year, and that she would like for them to be sent to her home address on file. Please f/u at [phone].

## 2023-01-06 ENCOUNTER — Encounter: Payer: Self-pay | Admitting: Family Medicine

## 2023-01-06 ENCOUNTER — Ambulatory Visit: Payer: No Typology Code available for payment source | Admitting: Family Medicine

## 2023-01-06 VITALS — BP 136/78 | HR 94 | Temp 98.7°F | Ht 61.5 in | Wt 185.2 lb

## 2023-01-06 DIAGNOSIS — N3946 Mixed incontinence: Secondary | ICD-10-CM

## 2023-01-06 DIAGNOSIS — R32 Unspecified urinary incontinence: Secondary | ICD-10-CM

## 2023-01-06 MED ORDER — ASSURANCE UNDERWEAR WOMENS MISC: 1.0000 | 11 refills | Status: AC

## 2023-01-06 MED ORDER — OXYBUTYNIN CHLORIDE ER 10 MG PO TB24: 10.0000 mg | ORAL_TABLET | Freq: Every day | ORAL | 1 refills | Status: DC

## 2023-01-06 NOTE — Progress Notes (Signed)
Subjective:    Patient ID: [name], female    DOB: 01/16/1950, 72 y.o.   MRN: [id number] Patient is here today requesting incontinence supplies.  She states that her neighbor gets incontinence supplies for urinary incontinence.  She would like this as well.  The patient reports symptoms of stress incontinence and urge incontinence.  She states that she experiences bladder leakage with coughing, sneezing, and straining.  However she also states that she goes to the bathroom every 10 minutes.  She reports that she feels a sudden urge to urinate and she is unable to make it to the bathroom in time.  She provides the example shopping recently with her husband.  She told them that they needed to go home immediately however she was unable to even make it out of the store.  Patient had an episode of urinary incontinence where she soaked her pants and was extremely embarrassed.  She denies any dysuria or fevers.   Past Medical History:  Diagnosis Date   Chronic back pain    Chronic pain in left shoulder    Depression    Dysphagia    GERD (gastroesophageal reflux disease)    Headache(784.0)    Hypertension    Vertigo    Past Surgical History:  Procedure Laterality Date   COLONOSCOPY     ESOPHAGEAL DILATION N/A 11/20/2014   Procedure: ESOPHAGEAL DILATION;  Surgeon: Corbin Ade, MD;  [facility];  Service: Endoscopy;  Laterality: N/A;   ESOPHAGOGASTRODUODENOSCOPY     ESOPHAGOGASTRODUODENOSCOPY N/A 11/10/2014   YQI:HKVQQVZDG schatzkis ringerosive reflux esophagitis   ESOPHAGOGASTRODUODENOSCOPY N/A 11/20/2014   LOV:FIEPPIRJ'J ring s/p dilation/HH   TRACHEOSTOMY     TUBAL LIGATION     Current Outpatient Medications on File Prior to Visit  Medication Sig Dispense Refill   ibuprofen (ADVIL) 800 MG tablet Take 1 tablet (800 mg total) by mouth every 8 (eight) hours as needed. 30 tablet 1   metoprolol succinate (TOPROL-XL) 50 MG 24 hr tablet TAKE 1 TABLET BY MOUTH WITH OR  IMMEDIATELY FOLLOWING A MEAL 90 tablet 0   naproxen (NAPROSYN) 500 MG tablet TAKE 1 TABLET BY MOUTH 2 TIMES DAILY WITH A MEAL. 30 tablet 0   pantoprazole (PROTONIX) 40 MG tablet Take 1 tablet (40 mg total) by mouth daily. 90 tablet 3   albuterol (VENTOLIN HFA) 108 (90 Base) MCG/ACT inhaler Inhale 2 puffs into the lungs every 6 (six) hours as needed for wheezing or shortness of breath. (Patient not taking: Reported on 01/06/2023) 1 each 0   calcium carbonate (TUMS - DOSED IN MG ELEMENTAL CALCIUM) 500 MG chewable tablet Chew 1 tablet by mouth daily. (Patient not taking: Reported on 01/06/2023)     diclofenac Sodium (VOLTAREN) 1 % GEL Apply 4 g topically 4 (four) times daily. (Patient not taking: Reported on 01/06/2023) 100 g 0   escitalopram (LEXAPRO) 10 MG tablet Take 1 tablet (10 mg total) by mouth daily. (Patient not taking: Reported on 01/06/2023) 90 tablet 3   meloxicam (MOBIC) 15 MG tablet Take 1 tablet (15 mg total) by mouth daily. (Patient not taking: Reported on 01/06/2023) 30 tablet 0   [DISCONTINUED] escitalopram (LEXAPRO) 20 MG tablet Take 1 tablet (20 mg total) by mouth daily. (Patient not taking: Reported on 10/05/2021) 90 tablet 3   No current facility-administered medications on file prior to visit.   No Known Allergies Social History   Socioeconomic History   Marital status: Married    Spouse name: Debroah Loop  Kessner   Number of children: Not on file   Years of education: 12   Highest education level: 12th grade  Occupational History   Not on file  Tobacco Use   Smoking status: Never    Passive exposure: Never   Smokeless tobacco: Never  Vaping Use   Vaping status: Never Used  Substance and Sexual Activity   Alcohol use: No   Drug use: Never   Sexual activity: Not Currently    Birth control/protection: None  Other Topics Concern   Not on file  Social History Narrative   Not on file   Social Drivers of Health   Financial Resource Strain: Medium Risk (08/03/2021)    Overall Financial Resource Strain (CARDIA)    Difficulty of Paying Living Expenses: Somewhat hard  Food Insecurity: No Food Insecurity (07/29/2022)   Hunger Vital Sign    Worried About Running Out of Food in the Last Year: Never true    Ran Out of Food in the Last Year: Never true  Transportation Needs: No Transportation Needs (07/29/2022)   PRAPARE - Administrator, Civil Service (Medical): No    Lack of Transportation (Non-Medical): No  Physical Activity: Inactive (07/29/2022)   Exercise Vital Sign    Days of Exercise per Week: 0 days    Minutes of Exercise per Session: 0 min  Stress: No Stress Concern Present (07/29/2022)   [employer] of Occupational Health - Occupational Stress Questionnaire    Feeling of Stress : Only a little  Social Connections: Moderately Isolated (07/29/2022)   Social Connection and Isolation Panel [NHANES]    Frequency of Communication with Friends and Family: More than three times a week    Frequency of Social Gatherings with Friends and Family: Twice a week    Attends Religious Services: Never    Database administrator or Organizations: No    Attends Banker Meetings: Never    Marital Status: Married  Catering manager Violence: Not At Risk (07/29/2022)   Humiliation, Afraid, Rape, and Kick questionnaire    Fear of Current or Ex-Partner: No    Emotionally Abused: No    Physically Abused: No    Sexually Abused: No     Review of Systems  Musculoskeletal:  Positive for back pain.  All other systems reviewed and are negative.      Objective:   Physical Exam Vitals reviewed.  Constitutional:      General: She is not in acute distress.    Appearance: She is well-developed. She is obese. She is not ill-appearing or toxic-appearing.  HENT:     Mouth/Throat:     Mouth: Mucous membranes are moist.     Pharynx: No oropharyngeal exudate or posterior oropharyngeal erythema.  Neck:     Vascular: No carotid bruit.   Cardiovascular:     Rate and Rhythm: Normal rate and regular rhythm.     Pulses: Normal pulses.     Heart sounds: Normal heart sounds. No murmur heard.    No friction rub. No gallop.  Pulmonary:     Effort: Pulmonary effort is normal. No respiratory distress.     Breath sounds: Normal breath sounds. No stridor. No wheezing, rhonchi or rales.  Chest:     Chest wall: No deformity, swelling, tenderness, crepitus or edema.  Abdominal:     General: Bowel sounds are normal. There is no distension.     Palpations: Abdomen is soft. There is no mass.  Tenderness: There is no guarding or rebound.     Hernia: No hernia is present.  Musculoskeletal:     Cervical back: Neck supple.     Right lower leg: No edema.     Left lower leg: No edema.  Lymphadenopathy:     Cervical: No cervical adenopathy.  Neurological:     General: No focal deficit present.     Mental Status: She is alert and oriented to person, place, and time.     Cranial Nerves: No cranial nerve deficit.     Motor: No weakness.     Coordination: Coordination normal.           Assessment & Plan:  Urinary incontinence, unspecified type - Plan: Urinalysis, Routine w reflex microscopic  Mixed stress and urge urinary incontinence  Patient has mixed urinary incontinence.  She has both stress and urge incontinence.  I will try the patient on Ditropan XL 10 mg a day for urge incontinence.  The patient will let me know over the next 30 days whether this medication helped.  I also wrote the patient a prescription for female undergarments.  She states that the small pack typically last for 1 week.  Therefore she would need 4 packs/month.

## 2023-01-07 ENCOUNTER — Telehealth: Payer: Self-pay

## 2023-01-07 NOTE — Telephone Encounter (Signed)
Copied from CRM [id number]. Topic: General - Other >> Jan 07, 2023 12:45 PM Gaetano Hawthorne wrote: Reason for CRM: Patient is calling to request her Depends to be issued through [facility] (phone # [phone]). Patient spoke with her neighbor who mentioned that they ship her supplies via mail/UPS. Patient would like assistance with this process.

## 2023-01-10 DIAGNOSIS — R32 Unspecified urinary incontinence: Secondary | ICD-10-CM | POA: Insufficient documentation

## 2023-01-24 ENCOUNTER — Telehealth: Payer: Self-pay

## 2023-01-24 ENCOUNTER — Other Ambulatory Visit: Payer: Self-pay | Admitting: Family Medicine

## 2023-01-24 DIAGNOSIS — I10 Essential (primary) hypertension: Secondary | ICD-10-CM

## 2023-01-24 NOTE — Telephone Encounter (Signed)
 Copied from CRM [id number]. Topic: Appointments - Appointment Scheduling >> Jan 24, 2023  9:32 AM [name] B wrote: Patient/patient representative is calling to schedule an appointment. Refer to attachments for appointment information.

## 2023-01-25 ENCOUNTER — Telehealth: Payer: Self-pay

## 2023-01-25 ENCOUNTER — Other Ambulatory Visit: Payer: Self-pay | Admitting: Family Medicine

## 2023-01-25 DIAGNOSIS — I10 Essential (primary) hypertension: Secondary | ICD-10-CM

## 2023-01-25 NOTE — Telephone Encounter (Signed)
 Copied from CRM [id number]. Topic: Clinical - Medication Refill >> Jan 25, 2023  1:33 PM Benton KIDD wrote: Most Recent Primary Care Visit:  Provider: DUANNE LOWERS T  Department: [facility]  Visit Type: OFFICE VISIT  Date: 01/06/2023  Medication: metoprolol  succinate (TOPROL -XL) 50 MG 24 hr tablet   Has the patient contacted their pharmacy? Yes they sent a request and waiting on request  (Agent: If no, request that the patient contact the pharmacy for the refill. If patient does not wish to contact the pharmacy document the reason why and proceed with request.) (Agent: If yes, when and what did the pharmacy advise?)  Is this the correct pharmacy for this prescription? Yes If no, delete pharmacy and type the correct one.  This is the patient's preferred pharmacy:  [facility] [place], [place] - [id number] [place] [address] Phone: [phone] Fax: [phone]   Has the prescription been filled recently? No  90 day supply patient not sure when last time   Is the patient out of the medication? No she has 2 left  Has the patient been seen for an appointment in the last year OR does the patient have an upcoming appointment? Yes  Can we respond through MyChart? No  Agent: Please be advised that Rx refills may take up to 3 business days. We ask that you follow-up with your pharmacy.

## 2023-01-25 NOTE — Telephone Encounter (Signed)
 Sending to [facility]. As requested.   LOV 01/06/2023.  Has upcoming appt. On 02/01/2023.   All criteria met for refill per policy.  Requested Prescriptions  Pending Prescriptions Disp Refills   metoprolol  succinate (TOPROL -XL) 50 MG 24 hr tablet [Pharmacy Med Name: metoprolol  succinate ER 50 mg tablet,extended release 24 hr] 90 tablet 0    Sig: TAKE (1) TABLET BY MOUTH ONCE DAILY. TAKE WITH OR IMMEDIATELY FOLLOWING A MEAL.     Cardiovascular:  Beta Blockers Failed - 01/25/2023  2:02 PM      Failed - Valid encounter within last 6 months    Recent Outpatient Visits           1 year ago Closed nondisplaced fracture of phalanx of right little finger, unspecified phalanx, initial encounter   [facility] Duanne, Butler DASEN, MD   1 year ago Right wrist pain   [facility] Duanne Butler DASEN, MD   2 years ago Gastroesophageal reflux disease, unspecified whether esophagitis present   [facility] Duanne Butler DASEN, MD   2 years ago Pedal edema   [facility] Chandra Harlene LABOR, NP   2 years ago Left arm pain   [facility] Pickard, Butler DASEN, MD              Passed - Last BP in normal range    BP Readings from Last 1 Encounters:  01/06/23 136/78         Passed - Last Heart Rate in normal range    Pulse Readings from Last 1 Encounters:  01/06/23 94

## 2023-01-25 NOTE — Telephone Encounter (Signed)
 Copied from CRM [id number]. Topic: Clinical - Medication Refill >> Jan 25, 2023  1:43 PM Benton KIDD wrote: Most Recent Primary Care Visit:  Provider: DUANNE LOWERS T  Department: [facility]  Visit Type: OFFICE VISIT  Date: 01/06/2023  Medication:   Has the patient contacted their pharmacy?  (Agent: If no, request that the patient contact the pharmacy for the refill. If patient does not wish to contact the pharmacy document the reason why and proceed with request.) (Agent: If yes, when and what did the pharmacy advise?)  Is this the correct pharmacy for this prescription?  If no, delete pharmacy and type the correct one.  This is the patient's preferred pharmacy:  [id number] - [place], [place] - [address] AT [facility] [address] [place] [address] Phone: [phone] Fax: [phone]  [facility] [place], [place] - [address] [place] [address] Phone: [phone] Fax: [phone]   Has the prescription been filled recently?   Is the patient out of the medication?   Has the patient been seen for an appointment in the last year OR does the patient have an upcoming appointment?   Can we respond through MyChart?   Agent: Please be advised that Rx refills may take up to 3 business days. We ask that you follow-up with your pharmacy.

## 2023-01-25 NOTE — Telephone Encounter (Deleted)
 Copied from CRM [id number]. Topic: Clinical - Medication Refill >> Jan 25, 2023  1:33 PM Benton KIDD wrote: Most Recent Primary Care Visit:  Provider: DUANNE LOWERS T  Department: [facility]  Visit Type: OFFICE VISIT  Date: 01/06/2023  Medication: ***  Has the patient contacted their pharmacy? {yes/no:20286} (Agent: If no, request that the patient contact the pharmacy for the refill. If patient does not wish to contact the pharmacy document the reason why and proceed with request.) (Agent: If yes, when and what did the pharmacy advise?)  Is this the correct pharmacy for this prescription? {yes/no:20286} If no, delete pharmacy and type the correct one.  This is the patient's preferred pharmacy:  [id number] - [place], [place] - [address] AT [facility] [address] [place] [address] Phone: [phone] Fax: [phone]  [facility] - [place], [place] - [address] [place] [address] Phone: [phone] Fax: [phone]   Has the prescription been filled recently? {yes/no:20286}  Is the patient out of the medication? {yes/no:20286}  Has the patient been seen for an appointment in the last year OR does the patient have an upcoming appointment? {yes/no:20286}  Can we respond through MyChart? {yes/no:20286}  Agent: Please be advised that Rx refills may take up to 3 business days. We ask that you follow-up with your pharmacy.

## 2023-01-28 ENCOUNTER — Ambulatory Visit: Payer: No Typology Code available for payment source | Admitting: Family Medicine

## 2023-01-30 ENCOUNTER — Other Ambulatory Visit: Payer: Self-pay | Admitting: Family Medicine

## 2023-02-01 ENCOUNTER — Encounter: Payer: Self-pay | Admitting: Family Medicine

## 2023-02-01 ENCOUNTER — Ambulatory Visit (INDEPENDENT_AMBULATORY_CARE_PROVIDER_SITE_OTHER): Payer: Medicare Other | Admitting: Family Medicine

## 2023-02-01 VITALS — BP 136/76 | HR 68 | Ht 61.5 in | Wt 181.4 lb

## 2023-02-01 DIAGNOSIS — R3589 Other polyuria: Secondary | ICD-10-CM

## 2023-02-01 DIAGNOSIS — R739 Hyperglycemia, unspecified: Secondary | ICD-10-CM | POA: Diagnosis not present

## 2023-02-01 DIAGNOSIS — R0789 Other chest pain: Secondary | ICD-10-CM | POA: Diagnosis not present

## 2023-02-01 DIAGNOSIS — F4321 Adjustment disorder with depressed mood: Secondary | ICD-10-CM

## 2023-02-01 DIAGNOSIS — H918X3 Other specified hearing loss, bilateral: Secondary | ICD-10-CM | POA: Diagnosis not present

## 2023-02-01 DIAGNOSIS — F4320 Adjustment disorder, unspecified: Secondary | ICD-10-CM

## 2023-02-01 MED ORDER — ALPRAZOLAM 0.5 MG PO TABS: 0.5000 mg | ORAL_TABLET | Freq: Three times a day (TID) | ORAL | 0 refills | Status: DC | PRN

## 2023-02-01 MED ORDER — ESCITALOPRAM OXALATE 10 MG PO TABS: 10.0000 mg | ORAL_TABLET | Freq: Every day | ORAL | 11 refills | Status: AC

## 2023-02-01 MED ORDER — ALPRAZOLAM 0.5 MG PO TABS: 0.5000 mg | ORAL_TABLET | Freq: Three times a day (TID) | ORAL | 0 refills | Status: AC | PRN

## 2023-02-01 NOTE — Progress Notes (Signed)
Subjective:    Patient ID: [name], female    DOB: 28-Dec-1950, 73 y.o.   MRN: [id number]  Patient's husband recently passed away unexpectedly.  She is having a difficult time sleeping.  She reports feeling depressed.  She reports low energy.  She reports poor appetite.  She reports feeling anxious and overwhelmed at times.  She is supposed to be on Lexapro for depression however she stopped this at some point in the past and she does not remember why.  Seems that depression has worsened dramatically since the passing of her husband.  She is here today with a family member requesting something to help her sleep at night.  Patient also had an episode of chest pain.  This occurred when she discovered that her husband passed away.  She states that she was standing at the time.  She felt the pain in her chest that lasted a few minutes and then resolved on its own.  She denies any angina.  She denies any dyspnea on exertion.  She denies any nausea or vomiting.  EKG today shows normal sinus rhythm.  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  She has a left axis deviation with Q waves in 2 3 and aVF.  However these are old findings are all of her previous EKGs dating back to 2019. Past Medical History:  Diagnosis Date   Chronic back pain    Chronic pain in left shoulder    Depression    Dysphagia    GERD (gastroesophageal reflux disease)    Headache(784.0)    Hypertension    Vertigo    Past Surgical History:  Procedure Laterality Date   COLONOSCOPY     ESOPHAGEAL DILATION N/A 11/20/2014   Procedure: ESOPHAGEAL DILATION;  Surgeon: Corbin Ade, MD;  Location: [facility];  Service: Endoscopy;  Laterality: N/A;   ESOPHAGOGASTRODUODENOSCOPY     ESOPHAGOGASTRODUODENOSCOPY N/A 11/10/2014   OZD:GUYQIHKVQ schatzkis ringerosive reflux esophagitis   ESOPHAGOGASTRODUODENOSCOPY N/A 11/20/2014   QVZ:DGLOVFIE'P ring s/p dilation/HH   TRACHEOSTOMY     TUBAL LIGATION     Current Outpatient Medications  on File Prior to Visit  Medication Sig Dispense Refill   Incontinence Supply Disposable (ASSURANCE UNDERWEAR WOMENS) MISC 1 Box by Does not apply route once a week. 4 each 11   metoprolol succinate (TOPROL-XL) 50 MG 24 hr tablet TAKE (1) TABLET BY MOUTH ONCE DAILY. TAKE WITH OR IMMEDIATELY FOLLOWING A MEAL. 90 tablet 0   oxybutynin (DITROPAN-XL) 10 MG 24 hr tablet TAKE 1 TABLET BY MOUTH EVERYDAY AT BEDTIME 90 tablet 1   pantoprazole (PROTONIX) 40 MG tablet Take 1 tablet (40 mg total) by mouth daily. 90 tablet 3   albuterol (VENTOLIN HFA) 108 (90 Base) MCG/ACT inhaler Inhale 2 puffs into the lungs every 6 (six) hours as needed for wheezing or shortness of breath. (Patient not taking: Reported on 08/02/2022) 1 each 0   [DISCONTINUED] escitalopram (LEXAPRO) 20 MG tablet Take 1 tablet (20 mg total) by mouth daily. (Patient not taking: Reported on 10/05/2021) 90 tablet 3   No current facility-administered medications on file prior to visit.     No Known Allergies Social History   Socioeconomic History   Marital status: Married    Spouse name: Jamise Nowack   Number of children: Not on file   Years of education: 12   Highest education level: 12th grade  Occupational History   Not on file  Tobacco Use   Smoking status: Never  Passive exposure: Never   Smokeless tobacco: Never  Vaping Use   Vaping status: Never Used  Substance and Sexual Activity   Alcohol use: No   Drug use: Never   Sexual activity: Not Currently    Birth control/protection: None  Other Topics Concern   Not on file  Social History Narrative   Not on file   Social Drivers of Health   Financial Resource Strain: Medium Risk (08/03/2021)   Overall Financial Resource Strain (CARDIA)    Difficulty of Paying Living Expenses: Somewhat hard  Food Insecurity: No Food Insecurity (07/29/2022)   Hunger Vital Sign    Worried About Running Out of Food in the Last Year: Never true    Ran Out of Food in the Last Year: Never  true  Transportation Needs: No Transportation Needs (07/29/2022)   PRAPARE - Administrator, Civil Service (Medical): No    Lack of Transportation (Non-Medical): No  Physical Activity: Inactive (07/29/2022)   Exercise Vital Sign    Days of Exercise per Week: 0 days    Minutes of Exercise per Session: 0 min  Stress: No Stress Concern Present (07/29/2022)   [employer] of Occupational Health - Occupational Stress Questionnaire    Feeling of Stress : Only a little  Social Connections: Moderately Isolated (07/29/2022)   Social Connection and Isolation Panel [NHANES]    Frequency of Communication with Friends and Family: More than three times a week    Frequency of Social Gatherings with Friends and Family: Twice a week    Attends Religious Services: Never    Database administrator or Organizations: No    Attends Banker Meetings: Never    Marital Status: Married  Catering manager Violence: Not At Risk (07/29/2022)   Humiliation, Afraid, Rape, and Kick questionnaire    Fear of Current or Ex-Partner: No    Emotionally Abused: No    Physically Abused: No    Sexually Abused: No     Review of Systems  All other systems reviewed and are negative.      Objective:   Physical Exam Vitals reviewed.  Constitutional:      General: She is not in acute distress.    Appearance: She is well-developed. She is obese. She is not ill-appearing or toxic-appearing.  Cardiovascular:     Rate and Rhythm: Normal rate and regular rhythm.     Pulses: Normal pulses.     Heart sounds: Normal heart sounds. No murmur heard.    No friction rub. No gallop.  Pulmonary:     Effort: Pulmonary effort is normal. No respiratory distress.     Breath sounds: Normal breath sounds. No stridor. No wheezing, rhonchi or rales.  Chest:     Chest wall: No deformity, swelling, tenderness, crepitus or edema.  Neurological:     Mental Status: She is alert and oriented to person, place, and time.           Assessment & Plan:  Other specified hearing loss of both ears - Plan: Ambulatory referral to Audiology  Polyuria - Plan: Hemoglobin A1c  Atypical chest pain  Grief reaction Patient has a history of depression.  I feel that she would benefit from taking Lexapro 10 mg a day.  Meanwhile I will give the patient Xanax 0.5 mg p.o. nightly as needed insomnia to help her sleep.  I explained that this medicine can cause dizziness and sedation and confusion.  Therefore I warned her family to monitor  for this.  Patient is concerned that she may be diabetic.  Her random blood sugars within normal.  However she would like to check an A1c because of her frequent urination.  I believe that this is most likely due to overactive bladder but I will be happy to check an A1c.  I believe her chest pain is atypical.  She denies any angina or dyspnea on exertion.  EKG today is stable and unchanged from 2019.  I recommended a coronary artery calcium score however the patient declined this.  She does request a referral for hearing aids and I will be glad to place a referral to an audiologist

## 2023-02-02 LAB — HEMOGLOBIN A1C
Hgb A1c MFr Bld: 5.5 %{Hb} (ref <5.7)
Mean Plasma Glucose: 111 mg/dL
eAG (mmol/L): 6.2 mmol/L

## 2023-02-03 ENCOUNTER — Encounter: Payer: Self-pay | Admitting: Family Medicine

## 2023-02-10 ENCOUNTER — Telehealth: Payer: Self-pay

## 2023-02-10 ENCOUNTER — Other Ambulatory Visit: Payer: Self-pay

## 2023-02-10 ENCOUNTER — Ambulatory Visit: Payer: Self-pay | Admitting: Family Medicine

## 2023-02-10 MED ORDER — OXYBUTYNIN CHLORIDE ER 10 MG PO TB24: 10.0000 mg | ORAL_TABLET | Freq: Every day | ORAL | 1 refills | Status: AC

## 2023-02-10 NOTE — Telephone Encounter (Signed)
Copied from CRM [id number]. Topic: Clinical - Prescription Issue >> Feb 10, 2023  9:39 AM Maxwell Marion wrote: Reason for CRM: Patient is calling regarding prescription oxybutynin (DITROPAN-XL) 10 MG 24 hr tablet. It was recently filled on 1/20 [facility] and [facility] is needing it [facility] to [facility] because she is unable to drive and they deliver.

## 2023-02-10 NOTE — Telephone Encounter (Signed)
Chief Complaint: Calling for crisis number Symptoms: depression Frequency: worsening over the last month Pertinent Negatives: Patient denies SI/HI/AVH Disposition: [] ED /[] Urgent Care (no appt availability in office) / [] Appointment(In office/virtual)/ []  [facility]/ [x] Home Care/ [] Refused Recommended Disposition /[] [facility]/ []  Follow-up with PCP Additional Notes: Patient called stating she had spoken with someone requesting the crisis hotline number but cannot remember what the number was. States her husband passed away a month ago. This RN provided 988 as well as Urgent Crisis [facility] [phone]. Patient denied SI/HI/AVH. Per protocol, home care appropriate. This RN offered to connect patient with either contact but she declined stating her niece was calling in during our call to check on her. Alerting PCP for review.   Copied from CRM [id number]. Topic: Clinical - Red Word Triage >> Feb 10, 2023 10:02 AM Hector Shade B wrote: [facility] that prompted transfer to Nurse Triage: patient is calling she is in a crisis lost husband last month on the 28th she's crying and needs to speak Reason for Disposition  Recent death of a loved one  Answer Assessment - Initial Assessment Questions 1. CONCERN: "What happened that made you call today?"     States she is having a tough time since her husband passed last month. 2. DEPRESSION SYMPTOM SCREENING: "How are you feeling overall?" (e.g., decreased energy, increased sleeping or difficulty sleeping, difficulty concentrating, feelings of sadness, guilt, hopelessness, or worthlessness)     Not eating well, feeling sad all the time. 3. RISK OF HARM - SUICIDAL IDEATION:  "Do you ever have thoughts of hurting or killing yourself?"  (e.g., yes, no, no but preoccupation with thoughts about death)   - INTENT:  "Do you have thoughts of hurting or killing yourself right NOW?" (e.g., yes, no, N/A)   - PLAN: "Do you have a specific plan for how  you would do this?" (e.g., gun, knife, overdose, no plan, N/A)     Denies SI 4. RISK OF HARM - HOMICIDAL IDEATION:  "Do you ever have thoughts of hurting or killing someone else?"  (e.g., yes, no, no but preoccupation with thoughts about death)   - INTENT:  "Do you have thoughts of hurting or killing someone right NOW?" (e.g., yes, no, N/A)   - PLAN: "Do you have a specific plan for how you would do this?" (e.g., gun, knife, no plan, N/A)      Denies HI 5. FUNCTIONAL IMPAIRMENT: "How have things been going for you overall? Have you had more difficulty than usual doing your normal daily activities?"  (e.g., better, same, worse; self-care, school, work, interactions)     States she is getting up and making coffee and washing dishes.  6. SUPPORT: "Who is with you now?" "Who do you live with?" "Do you have family or friends who you can talk to?"      Alone now, but does have a niece who has been staying with her as well as son who has been visiting.States they will not be there today. 7. THERAPIST: "Do you have a counselor or therapist? Name?"     Denies 8. STRESSORS: "Has there been any new stress or recent changes in your life?"     Loss of husband 9. ALCOHOL USE OR SUBSTANCE USE (DRUG USE): "Do you drink alcohol or use any illegal drugs?"     Denies 10. OTHER: "Do you have any other physical symptoms right now?" (e.g., fever)       Denies  Protocols used:  Depression-A-AH

## 2023-03-23 ENCOUNTER — Emergency Department (HOSPITAL_COMMUNITY)
Admission: EM | Admit: 2023-03-23 | Discharge: 2023-03-23 | Disposition: A | Discharge status: Home / Self Care | Attending: Emergency Medicine | Admitting: Emergency Medicine

## 2023-03-23 ENCOUNTER — Other Ambulatory Visit: Payer: Self-pay

## 2023-03-23 ENCOUNTER — Encounter (HOSPITAL_COMMUNITY): Payer: Self-pay

## 2023-03-23 ENCOUNTER — Ambulatory Visit: Payer: Self-pay | Admitting: Family Medicine

## 2023-03-23 DIAGNOSIS — I10 Essential (primary) hypertension: Secondary | ICD-10-CM | POA: Diagnosis not present

## 2023-03-23 DIAGNOSIS — R4781 Slurred speech: Secondary | ICD-10-CM | POA: Diagnosis not present

## 2023-03-23 DIAGNOSIS — Z79899 Other long term (current) drug therapy: Secondary | ICD-10-CM | POA: Diagnosis not present

## 2023-03-23 DIAGNOSIS — R6889 Other general symptoms and signs: Secondary | ICD-10-CM | POA: Diagnosis not present

## 2023-03-23 DIAGNOSIS — Z743 Need for continuous supervision: Secondary | ICD-10-CM | POA: Diagnosis not present

## 2023-03-23 DIAGNOSIS — M79603 Pain in arm, unspecified: Secondary | ICD-10-CM | POA: Diagnosis not present

## 2023-03-23 DIAGNOSIS — H5789 Other specified disorders of eye and adnexa: Secondary | ICD-10-CM | POA: Diagnosis present

## 2023-03-23 DIAGNOSIS — H43391 Other vitreous opacities, right eye: Secondary | ICD-10-CM | POA: Insufficient documentation

## 2023-03-23 DIAGNOSIS — R519 Headache, unspecified: Secondary | ICD-10-CM | POA: Diagnosis not present

## 2023-03-23 LAB — CBC
HCT: 41.6 % (ref 36.0–46.0)
Hemoglobin: 13.5 g/dL (ref 12.0–15.0)
MCH: 30.3 pg (ref 26.0–34.0)
MCHC: 32.5 g/dL (ref 30.0–36.0)
MCV: 93.5 fL (ref 80.0–100.0)
Platelets: 254 10*3/uL (ref 150–400)
RBC: 4.45 MIL/uL (ref 3.87–5.11)
RDW: 13.9 % (ref 11.5–15.5)
WBC: 7 10*3/uL (ref 4.0–10.5)
nRBC: 0 % (ref 0.0–0.2)

## 2023-03-23 LAB — URINALYSIS, ROUTINE W REFLEX MICROSCOPIC
Bacteria, UA: NONE SEEN
Bilirubin Urine: NEGATIVE
Glucose, UA: NEGATIVE mg/dL
Ketones, ur: NEGATIVE mg/dL
Leukocytes,Ua: NEGATIVE
Nitrite: NEGATIVE
Protein, ur: NEGATIVE mg/dL
Specific Gravity, Urine: 1.008 (ref 1.005–1.030)
pH: 5 (ref 5.0–8.0)

## 2023-03-23 LAB — CBG MONITORING, ED: Glucose-Capillary: 120 mg/dL — ABNORMAL HIGH (ref 70–99)

## 2023-03-23 LAB — COMPREHENSIVE METABOLIC PANEL
ALT: 18 U/L (ref 0–44)
AST: 23 U/L (ref 15–41)
Albumin: 3.7 g/dL (ref 3.5–5.0)
Alkaline Phosphatase: 61 U/L (ref 38–126)
Anion gap: 11 (ref 5–15)
BUN: 15 mg/dL (ref 8–23)
CO2: 22 mmol/L (ref 22–32)
Calcium: 9.2 mg/dL (ref 8.9–10.3)
Chloride: 104 mmol/L (ref 98–111)
Creatinine, Ser: 0.78 mg/dL (ref 0.44–1.00)
GFR, Estimated: >60 mL/min (ref >60)
Glucose, Bld: 110 mg/dL — ABNORMAL HIGH (ref 70–99)
Potassium: 3.8 mmol/L (ref 3.5–5.1)
Sodium: 137 mmol/L (ref 135–145)
Total Bilirubin: 0.3 mg/dL (ref 0.0–1.2)
Total Protein: 7.1 g/dL (ref 6.5–8.1)

## 2023-03-23 LAB — MAGNESIUM: Magnesium: 2.1 mg/dL (ref 1.7–2.4)

## 2023-03-23 NOTE — ED Notes (Signed)
 Patients niece called to get an update, and she informed nurse that patient is on Xanax and Lexapro but she is not taking Lexapro because it makes her feel weird.

## 2023-03-23 NOTE — ED Triage Notes (Signed)
 Pt BIB ems from home for stroke like symptoms. Per pt she went to bed around 0400 and was her normal self. Pt states she woke up around 0800 still felt normal. Pt states around 1230 to watch her stories and noticed a "zig zag" in her right eye. Pt states her rt hand feels like "she has oil on it." EDP at bedside during triage.

## 2023-03-23 NOTE — ED Notes (Signed)
 Patient states she has had a headache all day. Patient states her pain is a 5/10.

## 2023-03-23 NOTE — Telephone Encounter (Signed)
  Chief Complaint: neuro deficit Symptoms: garbled speech, R hand pain, L arm pain, visual changes Frequency: Upon waking at 0800 Pertinent Negatives: Patient denies CP, SOB Disposition: [x] ED /[] Urgent Care (no appt availability in office) / [] Appointment(In office/virtual)/ []  [facility]/ [] Home Care/ [] Refused Recommended Disposition /[] [facility]/ []  Follow-up with PCP Additional Notes: Patient calls reporting garbled speech, R hand pain, L arm pain upon waking at 0800 this morning. States she fell asleep around 0400 and felt normal at that time. Patient states she is having visual changes out of right eye, seeing "squiggly lines". Patient is tearful during call speaking of loss of husband. During call, speech is clear. Per protocol, patient to present to ED now for evaluation based of symptoms. Care advice reviewed, patient verbalized understanding and denies further questions at this time. States she is going to have her friend take her to the ED.  Alerting PCP for review.    Copied from CRM [id number]. Topic: Clinical - Red Word Triage >> Mar 23, 2023  2:02 PM Shelah Lewandowsky wrote: Red Word that prompted transfer to Nurse Triage: left arm pain, was not able to talk right when she woke up, headache- very depressed Reason for Disposition  [1] Loss of speech or garbled speech AND [2] sudden onset AND [3] brief (now gone)  Answer Assessment - Initial Assessment Questions 1. SYMPTOM: "What is the main symptom you are concerned about?" (e.g., weakness, numbness)     R hand felt funny, couldn't talk right this morning 2. ONSET: "When did this start?" (minutes, hours, days; while sleeping)     0800 this morning 3. LAST NORMAL: "When was the last time you (the patient) were normal (no symptoms)?"     Fell asleep around 0400 this morning, was normal at that time. 4. PATTERN "Does this come and go, or has it been constant since it started?"  "Is it present now?"     Comes and  goes, present in L arm and R hand now 5. CARDIAC SYMPTOMS: "Have you had any of the following symptoms: chest pain, difficulty breathing, palpitations?"     Denies 6. NEUROLOGIC SYMPTOMS: "Have you had any of the following symptoms: headache, dizziness, vision loss, double vision, changes in speech, unsteady on your feet?"     Reports changes in speech upon waking, seeing squiggly marks out of R eye when watching TV 7. OTHER SYMPTOMS: "Do you have any other symptoms?"     Pain  Protocols used: Neurologic Deficit-A-AH

## 2023-03-23 NOTE — Discharge Instructions (Signed)
 Your testing is normal, you have been having some floaters which is from your eye, you will need to be seen by the eye doctor, return to the ER immediately if you lose vision, or if you have severe worsening symptoms

## 2023-03-23 NOTE — ED Provider Notes (Signed)
 [place] EMERGENCY DEPARTMENT AT [facility] Provider Note   CSN: [id number] Arrival date & time: 03/23/23  1519     History  Chief Complaint  Patient presents with   Eye Problem    [name] is a 73 y.o. female.  The history is provided by the patient.  Eye Problem  This patient is a 73 year old female, she was treated for hypertension with metoprolol and takes alprazolam for anxiety.  She also takes Ditropan.  She takes no other daily medications.  She presents from home by ambulance transport after talking to a friend of hers that encouraged her to come get checked out.  She reports that she has been having some left arm pain intermittently over the last couple of months, it is not exertional and comes on at certain times.  She states that when the blood pressure cuff is squeezing on her left arm she gets numb in her left hand and then it gets better with a blood pressure cuff stop squeezing.  Today she noticed that she was having a squiggly line in her right eye vision, this was present when she was watching TV this morning around 10:00, lasted a short time and then went away.  She reports that on arrival she is not having that symptom.  In the middle of the exam she states that that symptom came back.  It is only in the right eye, there is no difficulty with speech ambulation coordination strength or sensation otherwise.    Home Medications Prior to Admission medications   Medication Sig Start Date End Date Taking? Authorizing Provider  albuterol (VENTOLIN HFA) 108 (90 Base) MCG/ACT inhaler Inhale 2 puffs into the lungs every 6 (six) hours as needed for wheezing or shortness of breath. 01/20/22  Yes Howard, Binnie Rail, FNP  ALPRAZolam Prudy Feeler) 0.5 MG tablet Take 1 tablet (0.5 mg total) by mouth 3 (three) times daily as needed for anxiety or sleep. 02/01/23  Yes Donita Brooks, MD  ibuprofen (ADVIL) 800 MG tablet Take 800 mg by mouth every 8 (eight) hours as needed for  moderate pain (pain score 4-6), mild pain (pain score 1-3) or headache.   Yes [provider]  metoprolol succinate (TOPROL-XL) 50 MG 24 hr tablet TAKE (1) TABLET BY MOUTH ONCE DAILY. TAKE WITH OR IMMEDIATELY FOLLOWING A MEAL. Patient taking differently: Take 50 mg by mouth daily. TAKE (1) TABLET BY MOUTH ONCE DAILY. TAKE WITH OR IMMEDIATELY FOLLOWING A MEAL. 01/25/23  Yes Donita Brooks, MD  oxybutynin (DITROPAN-XL) 10 MG 24 hr tablet Take 1 tablet (10 mg total) by mouth at bedtime. 02/10/23  Yes Donita Brooks, MD  escitalopram (LEXAPRO) 10 MG tablet Take 1 tablet (10 mg total) by mouth daily. Patient not taking: Reported on 03/23/2023 02/01/23   Donita Brooks, MD  Incontinence Supply Disposable (ASSURANCE UNDERWEAR WOMENS) MISC 1 Box by Does not apply route once a week. 01/06/23   Donita Brooks, MD  escitalopram (LEXAPRO) 20 MG tablet Take 1 tablet (20 mg total) by mouth daily. Patient not taking: Reported on 10/05/2021 04/09/19   Donita Brooks, MD      Allergies    Patient has no known allergies.    Review of Systems   Review of Systems  All other systems reviewed and are negative.   Physical Exam Updated Vital Signs BP (!) 139/49   Pulse 67   Temp 98.4 F (36.9 C) (Oral)   Resp 12  Ht 1.549 m (5\' 1" )   Wt 82.3 kg   SpO2 97%   BMI 34.28 kg/m  Physical Exam Vitals and nursing note reviewed.  Constitutional:      General: She is not in acute distress.    Appearance: She is well-developed.  HENT:     Head: Normocephalic and atraumatic.     Mouth/Throat:     Pharynx: No oropharyngeal exudate.  Eyes:     General: No scleral icterus.       Right eye: No discharge.        Left eye: No discharge.     Conjunctiva/sclera: Conjunctivae normal.     Pupils: Pupils are equal, round, and reactive to light.  Neck:     Thyroid: No thyromegaly.     Vascular: No JVD.  Cardiovascular:     Rate and Rhythm: Normal rate and regular rhythm.     Heart sounds:  Normal heart sounds. No murmur heard.    No friction rub. No gallop.  Pulmonary:     Effort: Pulmonary effort is normal. No respiratory distress.     Breath sounds: Normal breath sounds. No wheezing or rales.  Abdominal:     General: Bowel sounds are normal. There is no distension.     Palpations: Abdomen is soft. There is no mass.     Tenderness: There is no abdominal tenderness.  Musculoskeletal:        General: No tenderness. Normal range of motion.     Cervical back: Normal range of motion and neck supple.     Right lower leg: No edema.     Left lower leg: No edema.  Lymphadenopathy:     Cervical: No cervical adenopathy.  Skin:    General: Skin is warm and dry.     Findings: No erythema or rash.  Neurological:     Mental Status: She is alert.     Coordination: Coordination normal.     Comments: Speech is clear, cranial nerves III through XII are intact, memory is intact, strength is normal in all 4 extremities including grips and strength at the bilateral thighs, knees and ankles to extention and flexion, sensation is intact to light touch and pinprick in all 4 extremities. Coordination as tested by finger-nose-finger is normal, no limb ataxia. Normal gait, normal reflexes at the patellar tendons bilaterally  Psychiatric:        Behavior: Behavior normal.     ED Results / Procedures / Treatments   Labs (all labs ordered are listed, but only abnormal results are displayed) Labs Reviewed  URINALYSIS, ROUTINE W REFLEX MICROSCOPIC - Abnormal; Notable for the following components:      Result Value   Color, Urine STRAW (*)    Hgb urine dipstick MODERATE (*)    All other components within normal limits  COMPREHENSIVE METABOLIC PANEL - Abnormal; Notable for the following components:   Glucose, Bld 110 (*)    All other components within normal limits  CBG MONITORING, ED - Abnormal; Notable for the following components:   Glucose-Capillary 120 (*)    All other components within  normal limits  CBC  MAGNESIUM    EKG EKG Interpretation Date/Time:  Wednesday March 23 2023 15:29:56 EDT Ventricular Rate:  73 PR Interval:  193 QRS Duration:  92 QT Interval:  375 QTC Calculation: 414 R Axis:   -30  Text Interpretation: Sinus rhythm Atrial premature complexes Left axis deviation Low voltage, precordial leads Abnormal R-wave progression, late transition Baseline wander  in lead(s) V1 V6 Confirmed by Eber Hong ([id number]) on 03/23/2023 3:40:33 PM  Radiology No results found.  Procedures Procedures    Medications Ordered in ED Medications - No data to display  ED Course/ Medical Decision Making/ A&P                                 Medical Decision Making Amount and/or Complexity of Data Reviewed Labs: ordered.    This patient presents to the ED for concern of abnormal visual findings on the right eye, she is having some visual findings that are not consistent with visual field deficits, she does not have a visual acuity abnormality, this involves an extensive number of treatment options, and is a complaint that carries with it a high risk of complications and morbidity.  The differential diagnosis includes no or posterior vicious issue, she does have hypertension, her EKG does not show any ischemia though there are some PACs I do not think the patient has a focal neurologic deficit in fact she has what appears to be a globe issue on the right.   Co morbidities that complicate the patient evaluation  Obesity, hypertension, history of significant anxiety, the patient is tearful throughout the interview talking about her husband who died 2-1/2 months ago   Additional history obtained:  Additional history obtained from the record External records from outside source obtained and reviewed including no prior admissions to the hospital going back over the last couple of years No recent neuroimaging going back to 2010 when she had an MRI of the brain.   Lab  Tests:  I Ordered, and personally interpreted labs.  The pertinent results include: Urinalysis unremarkable, CBC and metabolic panel unremarkable, liver function testing magnesium and glucose unremarkable   Cardiac Monitoring: / EKG:  The patient was maintained on a cardiac monitor.  I personally viewed and interpreted the cardiac monitored which showed an underlying rhythm of: Sinus rhythm   Consultations Obtained:  Outpatient ophthalmology referral  Problem List / ED Course / Critical interventions / Medication management  Patient is done fine, she has some intermittent floaters in the right eye which seems to resolved, there is no loss of visual fields, she has no other neurologic findings, her workup has been unremarkable and vital signs are unremarkable.  I suspect that there is some degree of anxiety but also this is probably a posterior vitreous issue, there is no signs of retinal detachment and on my posterior exam with a ophthalmoscope I do not see any signs of retinal detachment at this time. I have reviewed the patients home medicines and have made adjustments as needed   Social Determinants of Health:  Anxiety regarding husband's death   Test / Admission - Considered:  Considered admission but no signs of stroke         Final Clinical Impression(s) / ED Diagnoses Final diagnoses:  Floaters in visual field, right    Rx / DC Orders ED Discharge Orders     None         Eber Hong, MD 03/23/23 1853

## 2023-05-05 ENCOUNTER — Other Ambulatory Visit: Payer: Self-pay | Admitting: Family Medicine

## 2023-05-05 DIAGNOSIS — I10 Essential (primary) hypertension: Secondary | ICD-10-CM

## 2023-05-06 NOTE — Telephone Encounter (Signed)
 Requested Prescriptions  Pending Prescriptions Disp Refills   metoprolol  succinate (TOPROL -XL) 50 MG 24 hr tablet [Pharmacy Med Name: metoprolol  succinate ER 50 mg tablet,extended release 24 hr] 90 tablet 0    Sig: TAKE ONE TABLET BY MOUTH EVERY DAY with OR immediately following a meal     Cardiovascular:  Beta Blockers Passed - 05/06/2023 11:00 AM      Passed - Last BP in normal range    BP Readings from Last 1 Encounters:  03/23/23 132/88         Passed - Last Heart Rate in normal range    Pulse Readings from Last 1 Encounters:  03/23/23 (!) 58         Passed - Valid encounter within last 6 months    Recent Outpatient Visits           3 months ago Other specified hearing loss of both ears   [facility] Pickard, Cisco Crest, MD   4 months ago Urinary incontinence, unspecified type   [place] [facility] Austine Lefort, MD   9 months ago Fatigue, unspecified type   [place] [facility] Austine Lefort, MD   1 year ago Viral URI with cough   [place] [facility] Jenelle Mis, FNP   1 year ago Hand pain, right   [place] [facility] Pickard, Cisco Crest, MD

## 2023-06-27 ENCOUNTER — Ambulatory Visit: Payer: Self-pay

## 2023-06-27 NOTE — Telephone Encounter (Signed)
 FYI Only or Action Required?: FYI only for provider  Patient was last seen in primary care on 02/01/2023 by Tiffany Lefort, MD. Called Nurse Triage reporting No chief complaint on file.. Symptoms began yesterday. Interventions attempted: Nothing. Symptoms are: unchanged.  Triage Disposition: No disposition on file.  Patient/caregiver understands and will follow disposition?:                Copied from CRM [id number]. Topic: Clinical - Red Word Triage >> Jun 27, 2023  9:54 AM [name] wrote: Red Word that prompted transfer to Nurse Triage: Patient has had swelling in both feet//Patient stated its hard walk//Symptoms since Friday Reason for Disposition  MILD or MODERATE ankle swelling (e.g., can't move joint normally, can't do usual activities) (Exceptions: Itchy, localized swelling; swelling is chronic.)  Answer Assessment - Initial Assessment Questions 1. ONSET: When did the swelling start? (e.g., minutes, hours, days)     Yesterday 2. LOCATION: What part of the leg is swollen?  Are both legs swollen or just one leg?     BIL feet, left is more swollen. 3. SEVERITY: How bad is the swelling? (e.g., localized; mild, moderate, severe)   - Localized: Small area of swelling localized to one leg.   - MILD pedal edema: Swelling limited to foot and ankle, pitting edema < 1/4 inch (6 mm) deep, rest and elevation eliminate most or all swelling.   - MODERATE edema: Swelling of lower leg to knee, pitting edema > 1/4 inch (6 mm) deep, rest and elevation only partially reduce swelling.   - SEVERE edema: Swelling extends above knee, facial or hand swelling present.      Moderate. 4. REDNESS: Does the swelling look red or infected?     No 5. PAIN: Is the swelling painful to touch? If Yes, ask: How painful is it?   (Scale 1-10; mild, moderate or severe)     No  6. FEVER: Do you have a fever? If Yes, ask: What is it, how was it measured, and when did it start?      No 7.  CAUSE: What do you think is causing the leg swelling?     Unknown  8. MEDICAL HISTORY: Do you have a history of blood clots (e.g., DVT), cancer, heart failure, kidney disease, or liver failure?     No  9. RECURRENT SYMPTOM: Have you had leg swelling before? If Yes, ask: When was the last time? What happened that time?     She states she has has this problem years ago, but cannot remember when exactly.  10. OTHER SYMPTOMS: Do you have any other symptoms? (e.g., chest pain, difficulty breathing)       No    Swelling goes into the ankles. No home care tried.  Protocols used: Leg Swelling and Edema-A-AH, Ankle Swelling-A-AH

## 2023-06-28 ENCOUNTER — Ambulatory Visit: Admitting: Family Medicine

## 2023-06-29 ENCOUNTER — Encounter: Payer: Self-pay | Admitting: Family Medicine

## 2023-06-29 ENCOUNTER — Ambulatory Visit (INDEPENDENT_AMBULATORY_CARE_PROVIDER_SITE_OTHER): Admitting: Family Medicine

## 2023-06-29 DIAGNOSIS — B372 Candidiasis of skin and nail: Secondary | ICD-10-CM | POA: Diagnosis not present

## 2023-06-29 DIAGNOSIS — R600 Localized edema: Secondary | ICD-10-CM

## 2023-06-29 DIAGNOSIS — G4700 Insomnia, unspecified: Secondary | ICD-10-CM | POA: Diagnosis not present

## 2023-06-29 MED ORDER — NYSTATIN 100000 UNIT/GM EX CREA: 1.0000 | TOPICAL_CREAM | Freq: Two times a day (BID) | CUTANEOUS | 1 refills | Status: AC

## 2023-06-29 MED ORDER — TRIAMCINOLONE ACETONIDE 0.1 % EX CREA: 1.0000 | TOPICAL_CREAM | Freq: Two times a day (BID) | CUTANEOUS | 1 refills | Status: AC

## 2023-06-29 MED ORDER — TRAZODONE HCL 50 MG PO TABS: 50.0000 mg | ORAL_TABLET | Freq: Every day | ORAL | 0 refills | Status: DC

## 2023-06-29 NOTE — Progress Notes (Signed)
 Patient Office Visit  Assessment & Plan:  Localized edema  Candidal intertrigo -     Nystatin; Apply 1 Application topically 2 (two) times daily. Apply BID  Dispense: 60 g; Refill: 1 -     Triamcinolone Acetonide; Apply 1 Application topically 2 (two) times daily. Apply BID  Dispense: 60 g; Refill: 1  Insomnia, unspecified type -     traZODone HCl; Take 1 tablet (50 mg total) by mouth at bedtime. Take one hour prior to bedtime  Dispense: 90 tablet; Refill: 0   Assessment and Plan    Intertrigo Rash consistent with intertrigo, likely yeast infection due to moisture and heat. Use of Depends may contribute. - Prescribed nystatin cream and triamcinolone cream for rash. - Advised frequent changing of Depends to reduce moisture. - Instructed to allow skin to air out when possible.  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 Previously prescribed Lexapro  but not currently taking it.  Insomnia Reports difficulty sleeping, open to discussing sleep aids. - Discuss potential sleep aids and will try Trazodone  Peripheral edema Intermittent ankle swelling, reduces with elevation. Diuretic not recommended due to risk of worsening leg cramps. - Advised leg elevation to reduce swelling. - Recommended reducing dietary salt intake.          No follow-ups on file.   Subjective:    Patient ID: [name], female    DOB: 1950/09/20  Age: 73 y.o. MRN: [id number]  Chief Complaint  Patient presents with   Rash    Pt c/o rash around lower abdomen and vaginal area. Pt states the rash just appeared 2 days ago. She said that the rash is reoccurring.    Foot Swelling    X 2 days.     Rash   Discussed the use of AI scribe software for clinical note transcription with the patient, who gave verbal consent to proceed.  History of Present Illness        [name] is a 73 year old female who presents with a rash and sleep disturbances.  She has a rash  primarily under her lower stomach and in the groin area, which she attributes to heat and moisture. The rash is red and sometimes painful, especially when pulling up her Depends or leave depends on too long. Moisture from wearing Depends contributes to the rash, and she mentions needing to change them more frequently. She has not tried any specific treatments for the rash yet.  She experiences sleep disturbances, stating she did not sleep at all the previous night. She mentions sitting up to sleep, possibly due to discomfort from her cats sleeping on her neck, which she believes may be causing her to scratch due to fleas. She has a history of taking medication for sleep but cannot recall the specific medication or its effectiveness.  She experiences leg cramps and swelling in her ankles, which she describes as 'come and go'. She denies using salt in her diet and has not noticed significant discomfort from the swelling worse left ankle/foot. Patient has not been sleeping in the bed. swelling does go down  She has a history of depression, for which she was prescribed Lexapro  in January, but she is not currently taking it. She has been feeling down since 1992/07/18 following her father's death and more recently due to the loss of her husband, to whom she was married for 38 years.  No current breathing difficulties but notes irritation when around smoke. Reports scratching all over,  particularly at night, and experiencing leg cramps and ankle swelling. Physical Exam HEENT: Cerumen present, more on the right ear. CHEST: Lungs clear to auscultation bilaterally. SKIN: Yeast-like rash on the lower abdomen and groin. Results LABS Blood Glucose: slightly elevated (03/2023) Blood Glucose: normal (01/2023) Assessment & Plan Intertrigo Rash consistent with intertrigo, likely yeast infection due to moisture and heat. Use of Depends may contribute. - Prescribed nystatin cream and triamcinolone cream for rash. -  Advised frequent changing of Depends to reduce moisture. - Instructed to allow skin to air out when possible.  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 Previously prescribed Lexapro  but not currently taking it.  Insomnia Reports difficulty sleeping, open to discussing sleep aids. - Discuss potential sleep aids and will try Trazodone  Peripheral edema Intermittent ankle swelling, reduces with elevation. Diuretic not recommended due to risk of worsening leg cramps. - Advised leg elevation to reduce swelling. - Recommended reducing dietary salt intake.    The ASCVD Risk score (Arnett DK, et al., 2019) failed to calculate for the following reasons:   Cannot find a previous HDL lab   Cannot find a previous total cholesterol lab  Past Medical History:  Diagnosis Date   Chronic back pain    Chronic pain in left shoulder    Depression    Dysphagia    GERD (gastroesophageal reflux disease)    Headache(784.0)    Hypertension    Vertigo    Past Surgical History:  Procedure Laterality Date   COLONOSCOPY     ESOPHAGEAL DILATION N/A 11/20/2014   Procedure: ESOPHAGEAL DILATION;  Surgeon: Suzette Espy, MD;  Location: [facility];  Service: Endoscopy;  Laterality: N/A;   ESOPHAGOGASTRODUODENOSCOPY     ESOPHAGOGASTRODUODENOSCOPY N/A 11/10/2014   ZOX:[id number] schatzkis ringerosive reflux esophagitis   ESOPHAGOGASTRODUODENOSCOPY N/A 11/20/2014   WJX:[id number]'A ring s/p dilation/HH   TRACHEOSTOMY     TUBAL LIGATION     Social History   Tobacco Use   Smoking status: Never    Passive exposure: Never   Smokeless tobacco: Never  Vaping Use   Vaping status: Never Used  Substance Use Topics   Alcohol use: No   Drug use: Never   No family history on file. No Known Allergies  Review of Systems  Skin:  Positive for rash.      Objective:    BP 124/84   Pulse 77   Temp 98.6 F (37 C)   Ht 5' 1 (1.549 m)   Wt 184 lb 4 oz (83.6 kg)   SpO2 98%   BMI  34.81 kg/m  BP Readings from Last 3 Encounters:  06/29/23 124/84  03/23/23 132/88  02/01/23 136/76   Wt Readings from Last 3 Encounters:  06/29/23 184 lb 4 oz (83.6 kg)  03/23/23 181 lb 7 oz (82.3 kg)  02/01/23 181 lb 6.4 oz (82.3 kg)    Physical Exam Vitals and nursing note reviewed.  Constitutional:      Appearance: Normal appearance.  HENT:     Head: Normocephalic.     Right Ear: There is impacted cerumen.     Left Ear: There is impacted cerumen.   Eyes:     Extraocular Movements: Extraocular movements intact.     Pupils: Pupils are equal, round, and reactive to light.    Cardiovascular:     Rate and Rhythm: Normal rate and regular rhythm.     Heart sounds: Normal heart sounds.  Pulmonary:     Effort: Pulmonary effort is normal.  Breath sounds: Normal breath sounds.   Musculoskeletal:     Right lower leg: No edema.     Left lower leg: No edema.   Skin:    Findings: Rash present.     Comments: Under lower abdominal area erythematous rash, nonvesicular. Patient wearing depends.    Neurological:     General: No focal deficit present.     Mental Status: She is alert and oriented to person, place, and time.   Psychiatric:        Attention and Perception: Attention normal.        Mood and Affect: Mood normal. Affect is tearful.        Speech: Speech normal.        Behavior: Behavior normal.        Thought Content: Thought content normal.        Judgment: Judgment normal.      No results found for any visits on 06/29/23.

## 2023-06-30 ENCOUNTER — Telehealth: Payer: Self-pay | Admitting: Family Medicine

## 2023-06-30 NOTE — Telephone Encounter (Signed)
 Copied from CRM [id number]. Topic: General - Other >> Jun 30, 2023 12:03 PM Antwanette L wrote: Reason for CRM: Patient would like for Dr. Cheril Cork to order her some adult diapers and bed pads. The patient said its too expensive for her.  Patient can be contacted at [phone]

## 2023-07-12 ENCOUNTER — Telehealth: Payer: Self-pay

## 2023-07-12 NOTE — Telephone Encounter (Signed)
 Pt called requesting incontinence supplies. I sent the order to [employer]. [employer] said they will be contacting shortly for more information.

## 2023-08-04 ENCOUNTER — Ambulatory Visit (INDEPENDENT_AMBULATORY_CARE_PROVIDER_SITE_OTHER): Payer: Medicare HMO | Admitting: *Deleted

## 2023-08-04 VITALS — Ht 61.5 in | Wt 184.0 lb

## 2023-08-04 DIAGNOSIS — Z0000 Encounter for general adult medical examination without abnormal findings: Secondary | ICD-10-CM

## 2023-08-04 NOTE — Progress Notes (Signed)
 Subjective:   [name] is a 73 y.o. female who presents for Medicare Annual (Subsequent) preventive examination.  Visit Complete: Virtual I connected with  Montel LOISE Acton on 08/04/23 by a audio enabled telemedicine application and verified that I am speaking with the correct person using two identifiers.  Patient Location: Home  Provider Location: Home Office  I discussed the limitations of evaluation and management by telemedicine. The patient expressed understanding and agreed to proceed.  Vital Signs: Because this visit was a virtual/telehealth visit, some criteria may be missing or patient reported. Any vitals not documented were not able to be obtained and vitals that have been documented are patient reported. .  Cardiac Risk Factors include: advanced age (>22men, >95 women);obesity (BMI >30kg/m2);sedentary lifestyle     Objective:    Today's Vitals   08/04/23 1603  Weight: 184 lb (83.5 kg)  Height: 5' 1.5 (1.562 m)   Body mass index is 34.2 kg/m.     08/04/2023    4:02 PM 03/23/2023    3:42 PM 07/29/2022    3:46 PM 03/20/2022    1:45 PM 08/03/2021    1:20 PM 07/09/2021    3:51 PM 04/23/2017   11:01 AM  Advanced Directives  Does Patient Have a Medical Advance Directive? No No No No No No No   Would patient like information on creating a medical advance directive? No - Patient declined No - Patient declined Yes (MAU/Ambulatory/Procedural Areas - Information given)  No - Patient declined No - Patient declined No - Patient declined      Data saved with a previous flowsheet row definition    Current Medications (verified) Outpatient Encounter Medications as of 08/04/2023  Medication Sig   albuterol  (VENTOLIN  HFA) 108 (90 Base) MCG/ACT inhaler Inhale 2 puffs into the lungs every 6 (six) hours as needed for wheezing or shortness of breath.   ALPRAZolam  (XANAX ) 0.5 MG tablet Take 1 tablet (0.5 mg total) by mouth 3 (three) times daily as needed for anxiety or sleep.    ibuprofen  (ADVIL ) 800 MG tablet Take 800 mg by mouth every 8 (eight) hours as needed for moderate pain (pain score 4-6), mild pain (pain score 1-3) or headache.   Incontinence Supply Disposable (ASSURANCE UNDERWEAR WOMENS) MISC 1 Box by Does not apply route once a week.   metoprolol  succinate (TOPROL -XL) 50 MG 24 hr tablet TAKE ONE TABLET BY MOUTH EVERY DAY with OR immediately following a meal   nystatin  cream (MYCOSTATIN ) Apply 1 Application topically 2 (two) times daily. Apply BID   oxybutynin  (DITROPAN -XL) 10 MG 24 hr tablet Take 1 tablet (10 mg total) by mouth at bedtime.   traZODone  (DESYREL ) 50 MG tablet Take 1 tablet (50 mg total) by mouth at bedtime. Take one hour prior to bedtime   escitalopram  (LEXAPRO ) 10 MG tablet Take 1 tablet (10 mg total) by mouth daily. (Patient not taking: Reported on 08/04/2023)   triamcinolone  cream (KENALOG ) 0.1 % Apply 1 Application topically 2 (two) times daily. Apply BID   [DISCONTINUED] escitalopram  (LEXAPRO ) 20 MG tablet Take 1 tablet (20 mg total) by mouth daily. (Patient not taking: Reported on 10/05/2021)   No facility-administered encounter medications on file as of 08/04/2023.    Allergies (verified) Patient has no known allergies.   History: Past Medical History:  Diagnosis Date   Chronic back pain    Chronic pain in left shoulder    Depression    Dysphagia    GERD (gastroesophageal reflux disease)  Yzjijryz(215.9)    Hypertension    Vertigo    Past Surgical History:  Procedure Laterality Date   COLONOSCOPY     ESOPHAGEAL DILATION N/A 11/20/2014   Procedure: ESOPHAGEAL DILATION;  Surgeon: Lamar CHRISTELLA Hollingshead, MD;  Location: [facility];  Service: Endoscopy;  Laterality: N/A;   ESOPHAGOGASTRODUODENOSCOPY     ESOPHAGOGASTRODUODENOSCOPY N/A 11/10/2014   MFM:emnfpwzwu schatzkis ringerosive reflux esophagitis   ESOPHAGOGASTRODUODENOSCOPY N/A 11/20/2014   MFM:dryjusxp'd ring s/p dilation/HH   TRACHEOSTOMY     TUBAL LIGATION     History  reviewed. No pertinent family history. Social History   Socioeconomic History   Marital status: Married    Spouse name: Saniah Schroeter   Number of children: Not on file   Years of education: 12   Highest education level: 12th grade  Occupational History   Not on file  Tobacco Use   Smoking status: Never    Passive exposure: Never   Smokeless tobacco: Never  Vaping Use   Vaping status: Never Used  Substance and Sexual Activity   Alcohol use: No   Drug use: Never   Sexual activity: Not Currently    Birth control/protection: None  Other Topics Concern   Not on file  Social History Narrative   Not on file   Social Drivers of Health   Financial Resource Strain: Low Risk  (08/04/2023)   Overall Financial Resource Strain (CARDIA)    Difficulty of Paying Living Expenses: Not hard at all  Food Insecurity: No Food Insecurity (08/04/2023)   Hunger Vital Sign    Worried About Running Out of Food in the Last Year: Never true    Ran Out of Food in the Last Year: Never true  Transportation Needs: No Transportation Needs (08/04/2023)   PRAPARE - Administrator, Civil Service (Medical): No    Lack of Transportation (Non-Medical): No  Physical Activity: Inactive (08/04/2023)   Exercise Vital Sign    Days of Exercise per Week: 0 days    Minutes of Exercise per Session: 0 min  Stress: Stress Concern Present (08/04/2023)   [employer] of Occupational Health - Occupational Stress Questionnaire    Feeling of Stress: To some extent  Social Connections: Socially Isolated (08/04/2023)   Social Connection and Isolation Panel    Frequency of Communication with Friends and Family: Three times a week    Frequency of Social Gatherings with Friends and Family: Twice a week    Attends Religious Services: Never    Database administrator or Organizations: No    Attends Banker Meetings: Never    Marital Status: Widowed    Tobacco Counseling Counseling given: Not  Answered   Clinical Intake:  Pre-visit preparation completed: Yes  Pain : No/denies pain     Diabetes: No  How often do you need to have someone help you when you read instructions, pamphlets, or other written materials from your doctor or pharmacy?: 1 - Never  Interpreter Needed?: No  Information entered by :: Mliss Graff LPN   Activities of Daily Living    08/04/2023    4:18 PM  In your present state of health, do you have any difficulty performing the following activities:  Hearing? 0  Vision? 0  Difficulty concentrating or making decisions? 0  Walking or climbing stairs? 0  Dressing or bathing? 0  Doing errands, shopping? 0  Preparing Food and eating ? N  Using the Toilet? N  In the past six months, have  you accidently leaked urine? Y  Do you have problems with loss of bowel control? N  Managing your Medications? N  Managing your Finances? N  Housekeeping or managing your Housekeeping? N    Patient Care Team: Duanne Butler DASEN, MD as PCP - General (Family Medicine) Shaaron Lamar HERO, MD as Consulting Physician (Gastroenterology)  Indicate any recent Medical Services you may have received from other than [facility] providers in the past year (date may be approximate).     Assessment:   This is a routine wellness examination for Pristine.  Hearing/Vision screen Hearing Screening - Comments:: Has hearing aids Does not wear often Vision Screening - Comments:: Not up to date   Goals Addressed             This Visit's Progress    Patient Stated       No goals       Depression Screen    08/04/2023    4:05 PM 02/01/2023    9:54 AM 08/02/2022    2:29 PM 07/29/2022    3:43 PM 10/05/2021   10:21 AM 08/03/2021    1:08 PM 07/09/2021    3:43 PM  PHQ 2/9 Scores  PHQ - 2 Score 5 0 1 6 6 2 4   PHQ- 9 Score 8  11 11 26 2 5     Fall Risk    08/04/2023    4:01 PM 02/01/2023    9:54 AM 08/02/2022    2:28 PM 07/29/2022    3:45 PM 08/03/2021    1:17 PM  Fall Risk   Falls  in the past year? 0 0 1 1 1   Number falls in past yr: 0 0 0 0 0  Injury with Fall? 0 0 1 1 1   Risk for fall due to :  No Fall Risks History of fall(s);Impaired balance/gait;Impaired mobility;Impaired vision History of fall(s) History of fall(s);Impaired balance/gait;Impaired mobility  Follow up Falls evaluation completed;Education provided;Falls prevention discussed Falls prevention discussed Education provided;Falls prevention discussed;Falls evaluation completed Education provided;Falls prevention discussed;Falls evaluation completed Falls evaluation completed;Education provided;Falls prevention discussed      Data saved with a previous flowsheet row definition    MEDICARE RISK AT HOME: Medicare Risk at Home Any stairs in or around the home?: No If so, are there any without handrails?: No Home free of loose throw rugs in walkways, pet beds, electrical cords, etc?: Yes Adequate lighting in your home to reduce risk of falls?: Yes Life alert?: No Use of a cane, walker or w/c?: No Grab bars in the bathroom?: No Shower chair or bench in shower?: Yes Elevated toilet seat or a handicapped toilet?: No  TIMED UP AND GO:  Was the test performed?  No    Cognitive Function:        08/04/2023    4:03 PM 07/29/2022    3:46 PM 07/09/2021    3:55 PM  6CIT Screen  What Year? 0 points 0 points 0 points  What month? 0 points 0 points 0 points  What time? 0 points 0 points 0 points  Count back from 20 0 points 0 points 0 points  Months in reverse 2 points 2 points 4 points  Repeat phrase 4 points 2 points 2 points  Total Score 6 points 4 points 6 points    Immunizations Immunization History  Administered Date(s) Administered   Influenza,inj,Quad PF,6+ Mos 11/05/2016   Influenza-Unspecified 10/12/2014   Moderna Sars-Covid-2 Vaccination 03/29/2019, 04/27/2019, 12/13/2019    TDAP status: Due, Education  has been provided regarding the importance of this vaccine. Advised may receive this  vaccine at local pharmacy or Health Dept. Aware to provide a copy of the vaccination record if obtained from local pharmacy or Health Dept. Verbalized acceptance and understanding.  Flu Vaccine status: Up to date  Pneumococcal vaccine status: Due, Education has been provided regarding the importance of this vaccine. Advised may receive this vaccine at local pharmacy or Health Dept. Aware to provide a copy of the vaccination record if obtained from local pharmacy or Health Dept. Verbalized acceptance and understanding.  Covid-19 vaccine status: Information provided on how to obtain vaccines.   Qualifies for Shingles Vaccine? Yes   Zostavax completed No   Shingrix Completed?: No.    Education has been provided regarding the importance of this vaccine. Patient has been advised to call insurance company to determine out of pocket expense if they have not yet received this vaccine. Advised may also receive vaccine at local pharmacy or Health Dept. Verbalized acceptance and understanding.  Screening Tests Health Maintenance  Topic Date Due   Pneumococcal Vaccine: 50+ Years (1 of 1 - PCV) Never done   Zoster Vaccines- Shingrix (1 of 2) Never done   MAMMOGRAM  01/12/2012   DEXA SCAN  Never done   Colonoscopy  01/12/2020   COVID-19 Vaccine (4 - 2024-25 season) 09/12/2022   Hepatitis C Screening  02/01/2024 (Originally 10/28/1968)   INFLUENZA VACCINE  08/12/2023   Medicare Annual Wellness (AWV)  08/03/2024   Hepatitis B Vaccines  Aged Out   HPV VACCINES  Aged Out   Meningococcal B Vaccine  Aged Out   DTaP/Tdap/Td  Discontinued    Health Maintenance  Health Maintenance Due  Topic Date Due   Pneumococcal Vaccine: 50+ Years (1 of 1 - PCV) Never done   Zoster Vaccines- Shingrix (1 of 2) Never done   MAMMOGRAM  01/12/2012   DEXA SCAN  Never done   Colonoscopy  01/12/2020   COVID-19 Vaccine (4 - 2024-25 season) 09/12/2022    Colonoscopy declined  Mammogram    Education provided  Bone  Density Education proivded  Lung Cancer Screening: (Low Dose CT Chest recommended if Age 27-80 years, 20 pack-year currently smoking OR have quit w/in 15years.) does not qualify.   Lung Cancer Screening Referral:   Additional Screening:  Hepatitis C Screening:  never done  Vision Screening: Recommended annual ophthalmology exams for early detection of glaucoma and other disorders of the eye. Is the patient up to date with their annual eye exam?  No  Who is the provider or what is the name of the office in which the patient attends annual eye exams? Not up to date If pt is not established with a provider, would they like to be referred to a provider to establish care? No .   Dental Screening: Recommended annual dental exams for proper oral hygiene   Community Resource Referral / Chronic Care Management: CRR required this visit?  No   CCM required this visit?  No     Plan:     I have personally reviewed and noted the following in the patient's chart:   Medical and social history Use of alcohol, tobacco or illicit drugs  Current medications and supplements including opioid prescriptions. Patient is not currently taking opioid prescriptions. Functional ability and status Nutritional status Physical activity Advanced directives List of other physicians Hospitalizations, surgeries, and ER visits in previous 12 months Vitals Screenings to include cognitive, depression, and falls Referrals and  appointments  In addition, I have reviewed and discussed with patient certain preventive protocols, quality metrics, and best practice recommendations. A written personalized care plan for preventive services as well as general preventive health recommendations were provided to patient.     Mliss Graff, LPN   2/75/7974   After Visit Summary: (MyChart) Due to this being a telephonic visit, the after visit summary with patients personalized plan was offered to patient via MyChart    Nurse Notes:   Patient has the lexapro  is not taking . Education provided , advised to call the office with any questions or concerns with Depression.   Voiced understanding

## 2023-08-04 NOTE — Patient Instructions (Signed)
 [name] , Thank you for taking time to come for your Medicare Wellness Visit. I appreciate your ongoing commitment to your health goals. Please review the following plan we discussed and let me know if I can assist you in the future.   Screening recommendations/referrals: Colonoscopy: Education provided Mammogram: Education provided Bone Density: Education provided Recommended yearly ophthalmology/optometry visit for glaucoma screening and checkup Recommended yearly dental visit for hygiene and checkup  Vaccinations: Influenza vaccine: Education provided Pneumococcal vaccine: Education provided Tdap vaccine: Education provided        Preventive Care 65 Years and Older, Female Preventive care refers to lifestyle choices and visits with your health care provider that can promote health and wellness. What does preventive care include? A yearly physical exam. This is also called an annual well check. Dental exams once or twice a year. Routine eye exams. Ask your health care provider how often you should have your eyes checked. Personal lifestyle choices, including: Daily care of your teeth and gums. Regular physical activity. Eating a healthy diet. Avoiding tobacco and drug use. Limiting alcohol use. Practicing safe sex. Taking low-dose aspirin every day. Taking vitamin and mineral supplements as recommended by your health care provider. What happens during an annual well check? The services and screenings done by your health care provider during your annual well check will depend on your age, overall health, lifestyle risk factors, and family history of disease. Counseling  Your health care provider may ask you questions about your: Alcohol use. Tobacco use. Drug use. Emotional well-being. Home and relationship well-being. Sexual activity. Eating habits. History of falls. Memory and ability to understand (cognition). Work and work Astronomer. Reproductive  health. Screening  You may have the following tests or measurements: Height, weight, and BMI. Blood pressure. Lipid and cholesterol levels. These may be checked every 5 years, or more frequently if you are over 82 years old. Skin check. Lung cancer screening. You may have this screening every year starting at age 42 if you have a 30-pack-year history of smoking and currently smoke or have quit within the past 15 years. Fecal occult blood test (FOBT) of the stool. You may have this test every year starting at age 37. Flexible sigmoidoscopy or colonoscopy. You may have a sigmoidoscopy every 5 years or a colonoscopy every 10 years starting at age 71. Hepatitis C blood test. Hepatitis B blood test. Sexually transmitted disease (STD) testing. Diabetes screening. This is done by checking your blood sugar (glucose) after you have not eaten for a while (fasting). You may have this done every 1-3 years. Bone density scan. This is done to screen for osteoporosis. You may have this done starting at age 60. Mammogram. This may be done every 1-2 years. Talk to your health care provider about how often you should have regular mammograms. Talk with your health care provider about your test results, treatment options, and if necessary, the need for more tests. Vaccines  Your health care provider may recommend certain vaccines, such as: Influenza vaccine. This is recommended every year. Tetanus, diphtheria, and acellular pertussis (Tdap, Td) vaccine. You may need a Td booster every 10 years. Zoster vaccine. You may need this after age 79. Pneumococcal 13-valent conjugate (PCV13) vaccine. One dose is recommended after age 86. Pneumococcal polysaccharide (PPSV23) vaccine. One dose is recommended after age 45. Talk to your health care provider about which screenings and vaccines you need and how often you need them. This information is not intended to replace advice given to  you by your health care provider.  Make sure you discuss any questions you have with your health care provider. Document Released: 01/24/2015 Document Revised: 09/17/2015 Document Reviewed: 10/29/2014 Elsevier Interactive Patient Education  2017 [employer].  Fall Prevention in the Home Falls can cause injuries. They can happen to people of all ages. There are many things you can do to make your home safe and to help prevent falls. What can I do on the outside of my home? Regularly fix the edges of walkways and driveways and fix any cracks. Remove anything that might make you trip as you walk through a door, such as a raised step or threshold. Trim any bushes or trees on the path to your home. Use bright outdoor lighting. Clear any walking paths of anything that might make someone trip, such as rocks or tools. Regularly check to see if handrails are loose or broken. Make sure that both sides of any steps have handrails. Any raised decks and porches should have guardrails on the edges. Have any leaves, snow, or ice cleared regularly. Use sand or salt on walking paths during winter. Clean up any spills in your garage right away. This includes oil or grease spills. What can I do in the bathroom? Use night lights. Install grab bars by the toilet and in the tub and shower. Do not use towel bars as grab bars. Use non-skid mats or decals in the tub or shower. If you need to sit down in the shower, use a plastic, non-slip stool. Keep the floor dry. Clean up any water  that spills on the floor as soon as it happens. Remove soap buildup in the tub or shower regularly. Attach bath mats securely with double-sided non-slip rug tape. Do not have throw rugs and other things on the floor that can make you trip. What can I do in the bedroom? Use night lights. Make sure that you have a light by your bed that is easy to reach. Do not use any sheets or blankets that are too big for your bed. They should not hang down onto the floor. Have a  firm chair that has side arms. You can use this for support while you get dressed. Do not have throw rugs and other things on the floor that can make you trip. What can I do in the kitchen? Clean up any spills right away. Avoid walking on wet floors. Keep items that you use a lot in easy-to-reach places. If you need to reach something above you, use a strong step stool that has a grab bar. Keep electrical cords out of the way. Do not use floor polish or wax that makes floors slippery. If you must use wax, use non-skid floor wax. Do not have throw rugs and other things on the floor that can make you trip. What can I do with my stairs? Do not leave any items on the stairs. Make sure that there are handrails on both sides of the stairs and use them. Fix handrails that are broken or loose. Make sure that handrails are as long as the stairways. Check any carpeting to make sure that it is firmly attached to the stairs. Fix any carpet that is loose or worn. Avoid having throw rugs at the top or bottom of the stairs. If you do have throw rugs, attach them to the floor with carpet tape. Make sure that you have a light switch at the top of the stairs and the bottom of the stairs.  If you do not have them, ask someone to add them for you. What else can I do to help prevent falls? Wear shoes that: Do not have high heels. Have rubber bottoms. Are comfortable and fit you well. Are closed at the toe. Do not wear sandals. If you use a stepladder: Make sure that it is fully opened. Do not climb a closed stepladder. Make sure that both sides of the stepladder are locked into place. Ask someone to hold it for you, if possible. Clearly mark and make sure that you can see: Any grab bars or handrails. First and last steps. Where the edge of each step is. Use tools that help you move around (mobility aids) if they are needed. These include: Canes. Walkers. Scooters. Crutches. Turn on the lights when you  go into a dark area. Replace any light bulbs as soon as they burn out. Set up your furniture so you have a clear path. Avoid moving your furniture around. If any of your floors are uneven, fix them. If there are any pets around you, be aware of where they are. Review your medicines with your doctor. Some medicines can make you feel dizzy. This can increase your chance of falling. Ask your doctor what other things that you can do to help prevent falls. This information is not intended to replace advice given to you by your health care provider. Make sure you discuss any questions you have with your health care provider. Document Released: 10/24/2008 Document Revised: 06/05/2015 Document Reviewed: 02/01/2014 Elsevier Interactive Patient Education  2017 [employer].

## 2023-08-19 ENCOUNTER — Telehealth: Payer: Self-pay | Admitting: Family Medicine

## 2023-08-19 ENCOUNTER — Other Ambulatory Visit: Payer: Self-pay

## 2023-08-19 DIAGNOSIS — I10 Essential (primary) hypertension: Secondary | ICD-10-CM

## 2023-08-19 MED ORDER — METOPROLOL SUCCINATE ER 50 MG PO TB24: ORAL_TABLET | ORAL | 0 refills | Status: DC

## 2023-08-19 NOTE — Telephone Encounter (Signed)
 Prescription Request  08/19/2023  LOV: 02/01/2023  What is the name of the medication or equipment?   metoprolol  succinate (TOPROL -XL) 50 MG 24 hr tablet  **90 day script requested**  Have you contacted your pharmacy to request a refill? Yes   Which pharmacy would you like this sent to?  [facility] [place], [place] - [id number] [place] [address] Phone: [phone] Fax: [phone]    Patient notified that their request is being sent to the clinical staff for review and that they should receive a response within 2 business days.   Please advise pharmacist.

## 2023-09-02 ENCOUNTER — Encounter: Payer: Self-pay | Admitting: Radiology

## 2023-09-09 ENCOUNTER — Telehealth: Payer: Self-pay | Admitting: Family Medicine

## 2023-09-09 NOTE — Telephone Encounter (Signed)
 Prescription Request  09/09/2023  LOV: 02/01/2023  What is the name of the medication or equipment? HYDROcodone -acetaminophen  (NORCO/VICODIN) 5-325 MG per    Have you contacted your pharmacy to request a refill? Yes   Which pharmacy would you like this sent to?  [facility] [place], [place] - [id number] [place] [address] Phone: [phone] Fax: [phone]    Patient notified that their request is being sent to the clinical staff for review and that they should receive a response within 2 business days.   Please advise at [facility] [phone]

## 2023-09-09 NOTE — Telephone Encounter (Signed)
 Pt. Has been informed by phone . She understood that she would not be prescribed any pain medications by this office without evaluation.

## 2023-09-26 ENCOUNTER — Telehealth: Payer: Self-pay

## 2023-09-26 ENCOUNTER — Other Ambulatory Visit: Payer: Self-pay | Admitting: Family Medicine

## 2023-09-26 DIAGNOSIS — I10 Essential (primary) hypertension: Secondary | ICD-10-CM

## 2023-09-26 MED ORDER — METOPROLOL SUCCINATE ER 50 MG PO TB24: ORAL_TABLET | ORAL | 3 refills | Status: AC
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
## 2023-11-14 ENCOUNTER — Encounter: Payer: Self-pay | Admitting: Radiology

## 2023-12-22 ENCOUNTER — Other Ambulatory Visit: Payer: Self-pay | Admitting: Family Medicine

## 2023-12-22 NOTE — Telephone Encounter (Signed)
 Copied from CRM #[id number]. Topic: Clinical - Medication Refill >> Dec 22, 2023 12:05 PM Kendralyn S wrote: Medication:  ibuprofen  (ADVIL ) 800 MG tablet    Has the patient contacted their pharmacy? Yes (Agent: If no, request that the patient contact the pharmacy for the refill. If patient does not wish to contact the pharmacy document the reason why and proceed with request.) (Agent: If yes, when and what did the pharmacy advise?)  This is the patient's preferred pharmacy:  [facility] [place], [place] - [id number] [place] [address] Phone: [phone] Fax: [phone]  Is this the correct pharmacy for this prescription? Yes If no, delete pharmacy and type the correct one.   Has the prescription been filled recently? no  Is the patient out of the medication? No  Has the patient been seen for an appointment in the last year OR does the patient have an upcoming appointment? Yes  Can we respond through MyChart? No  Agent: Please be advised that Rx refills may take up to 3 business days. We ask that you follow-up with your pharmacy.

## 2023-12-26 MED ORDER — IBUPROFEN 800 MG PO TABS: 800.0000 mg | ORAL_TABLET | Freq: Three times a day (TID) | ORAL | 1 refills | Status: DC | PRN

## 2023-12-26 NOTE — Telephone Encounter (Signed)
 Requested medication (s) are due for refill today: routing for review  Requested medication (s) are on the active medication list: no  Last refill:  03/23/23  Future visit scheduled: no  Notes to clinic:  historical medication     Requested Prescriptions  Pending Prescriptions Disp Refills   ibuprofen  (ADVIL ) 800 MG tablet 30 tablet     Sig: Take 1 tablet (800 mg total) by mouth every 8 (eight) hours as needed for moderate pain (pain score 4-6), mild pain (pain score 1-3) or headache.     Analgesics:  NSAIDS Failed - 12/26/2023  9:00 AM      Failed - Manual Review: Labs are only required if the patient has taken medication for more than 8 weeks.      Passed - Cr in normal range and within 360 days    Creat  Date Value Ref Range Status  08/02/2022 0.84 0.60 - 1.00 mg/dL Final   Creatinine, Ser  Date Value Ref Range Status  03/23/2023 0.78 0.44 - 1.00 mg/dL Final         Passed - HGB in normal range and within 360 days    Hemoglobin  Date Value Ref Range Status  03/23/2023 13.5 12.0 - 15.0 g/dL Final         Passed - PLT in normal range and within 360 days    Platelets  Date Value Ref Range Status  03/23/2023 254 150 - 400 K/uL Final         Passed - HCT in normal range and within 360 days    HCT  Date Value Ref Range Status  03/23/2023 41.6 36.0 - 46.0 % Final         Passed - eGFR is 30 or above and within 360 days    GFR, Est African American  Date Value Ref Range Status  07/27/2019 79 > OR = 60 mL/min/1.21m2 Final   GFR, Est Non African American  Date Value Ref Range Status  07/27/2019 68 > OR = 60 mL/min/1.11m2 Final   GFR, Estimated  Date Value Ref Range Status  03/23/2023 >60 >60 mL/min Final    Comment:    (NOTE) Calculated using the CKD-EPI Creatinine Equation (2021)    eGFR  Date Value Ref Range Status  08/02/2022 73 > OR = 60 mL/min/1.72m2 Final         Passed - Patient is not pregnant      Passed - Valid encounter within last 12 months     Recent Outpatient Visits           6 months ago Localized edema   [place] [facility] Aletha Bene, MD   10 months ago Other specified hearing loss of both ears   [place] [facility] Duanne Butler DASEN, MD   11 months ago Urinary incontinence, unspecified type   [place] [facility] Duanne Butler DASEN, MD   1 year ago Fatigue, unspecified type   [place] [facility] Duanne Butler DASEN, MD   1 year ago Viral URI with cough   [place] [facility] Kayla Jeoffrey RAMAN, FNP

## 2024-01-31 ENCOUNTER — Other Ambulatory Visit: Payer: Self-pay | Admitting: Family Medicine

## 2024-01-31 ENCOUNTER — Other Ambulatory Visit: Payer: Self-pay

## 2024-01-31 ENCOUNTER — Ambulatory Visit: Payer: Self-pay | Admitting: Family Medicine

## 2024-01-31 DIAGNOSIS — J069 Acute upper respiratory infection, unspecified: Secondary | ICD-10-CM

## 2024-01-31 DIAGNOSIS — G4700 Insomnia, unspecified: Secondary | ICD-10-CM

## 2024-01-31 DIAGNOSIS — M5090 Cervical disc disorder, unspecified, unspecified cervical region: Secondary | ICD-10-CM

## 2024-01-31 MED ORDER — IBUPROFEN 800 MG PO TABS: 800.0000 mg | ORAL_TABLET | Freq: Three times a day (TID) | ORAL | 1 refills | Status: DC | PRN

## 2024-01-31 MED ORDER — PREDNISONE 20 MG PO TABS: ORAL_TABLET | ORAL | 0 refills | Status: AC

## 2024-01-31 MED ORDER — TRAZODONE HCL 50 MG PO TABS: 50.0000 mg | ORAL_TABLET | Freq: Every day | ORAL | 0 refills | Status: AC

## 2024-01-31 MED ORDER — ALBUTEROL SULFATE HFA 108 (90 BASE) MCG/ACT IN AERS: 2.0000 | INHALATION_SPRAY | Freq: Four times a day (QID) | RESPIRATORY_TRACT | 2 refills | Status: DC | PRN

## 2024-01-31 MED ORDER — IBUPROFEN 800 MG PO TABS: 800.0000 mg | ORAL_TABLET | Freq: Three times a day (TID) | ORAL | 1 refills | Status: AC | PRN

## 2024-01-31 MED ORDER — ALBUTEROL SULFATE HFA 108 (90 BASE) MCG/ACT IN AERS: 2.0000 | INHALATION_SPRAY | Freq: Four times a day (QID) | RESPIRATORY_TRACT | 2 refills | Status: AC | PRN

## 2024-01-31 NOTE — Telephone Encounter (Signed)
 FYI Only or Action Required?: Action required by provider: medication refill request, clinical question for provider, and update on patient condition.  Patient was last seen in primary care on 06/29/2023 by Tiffany Bene, MD.  Called Nurse Triage reporting Shortness of Breath.  Symptoms began a week ago.  Interventions attempted: Nothing. - inhaler broken or not working  Symptoms are: rapidly worsening.  Triage Disposition: Go to ED Now (Notify PCP)  Patient/caregiver understands and will follow disposition?: No, refuses disposition       Reason for Disposition  [1] MODERATE difficulty breathing (e.g., speaks in phrases, SOB even at rest, pulse 100-120) AND [2] NEW-onset or WORSE than normal  Answer Assessment - Initial Assessment Questions This RN recommended pt be examined in hospital, pt refusing, also no transportation at this time, requesting refills of albuterol  inhaler, trazodone  for sleep, and ibuprofen  800 mg. Advised pt call 911 or get to hospital asap if any new or worsening symptoms. Sending message to PCP office for call back to pt with further recommendations. Alerted [facility] to ED refusal.      1. RESPIRATORY STATUS: Describe your breathing? (e.g., wheezing, shortness of breath, unable to speak, severe coughing)      Audible wheezing with each breath Pt currently crying since had just talked with someone about late husband  2. ONSET: When did this breathing problem begin?      Last week  3. PATTERN Does the difficult breathing come and go, or has it been constant since it started?      Continual, intermittent struggling to breathe  4. SEVERITY: How bad is your breathing? (e.g., mild, moderate, severe)      yesterday struggling for each breath and several times last week  6. CARDIAC HISTORY: Do you have any history of heart disease? (e.g., heart attack, angina, bypass surgery, angioplasty)      HTN  7. LUNG HISTORY: Do you have any history of lung  disease?  (e.g., pulmonary embolus, asthma, emphysema)     Inhaler but not working, got one puff out of it then stopped working  9. OTHER SYMPTOMS: Do you have any other symptoms? (e.g., chest pain, cough, dizziness, fever, runny nose)     Interrupted sleep still from grief Bad back interrupting normal activities  Denies: Current struggling to breathe Changes to awareness/consciousness/speech Weakness more than usual Chest pain or tightness Stridor  Protocols used: Breathing Difficulty-A-AH

## 2024-02-03 ENCOUNTER — Ambulatory Visit: Admitting: Family Medicine

## 2024-05-05 IMAGING — DX DG HAND COMPLETE 3+V*R*
3 series · 3 of 3 positions shown · non-contrast
Comparison: April 27, 2021.

CLINICAL DATA: Right fifth finger pain after fall.

EXAM:
RIGHT HAND - COMPLETE 3+ VIEW

[hand pa]
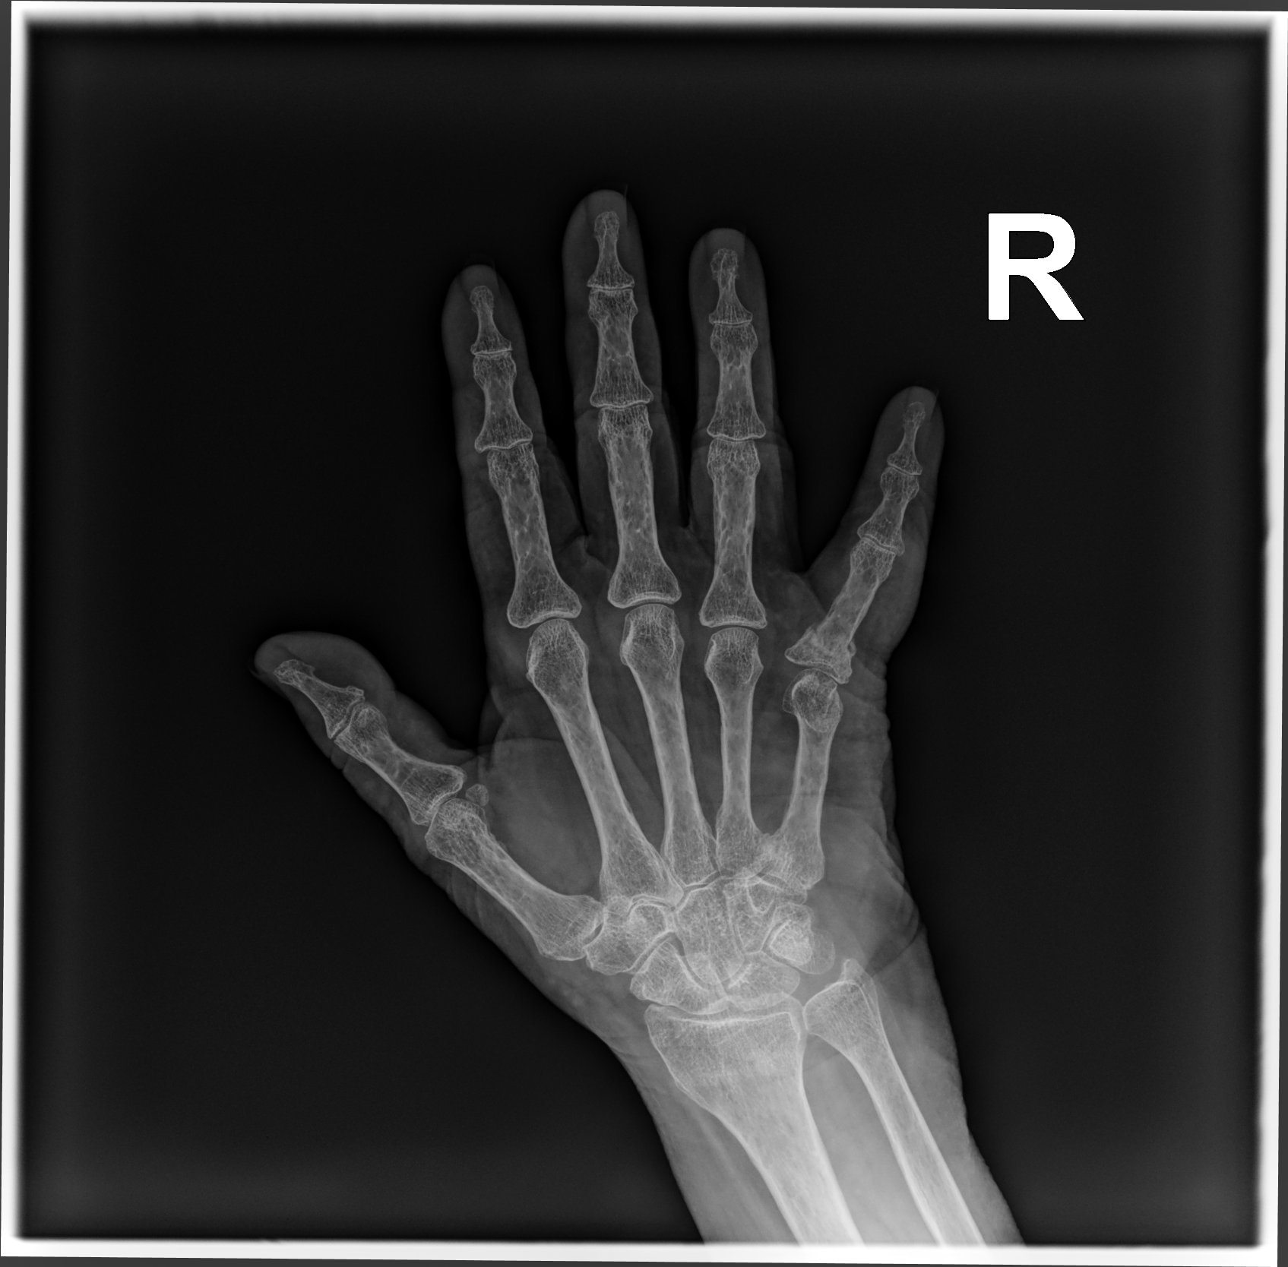

[hand mlo]
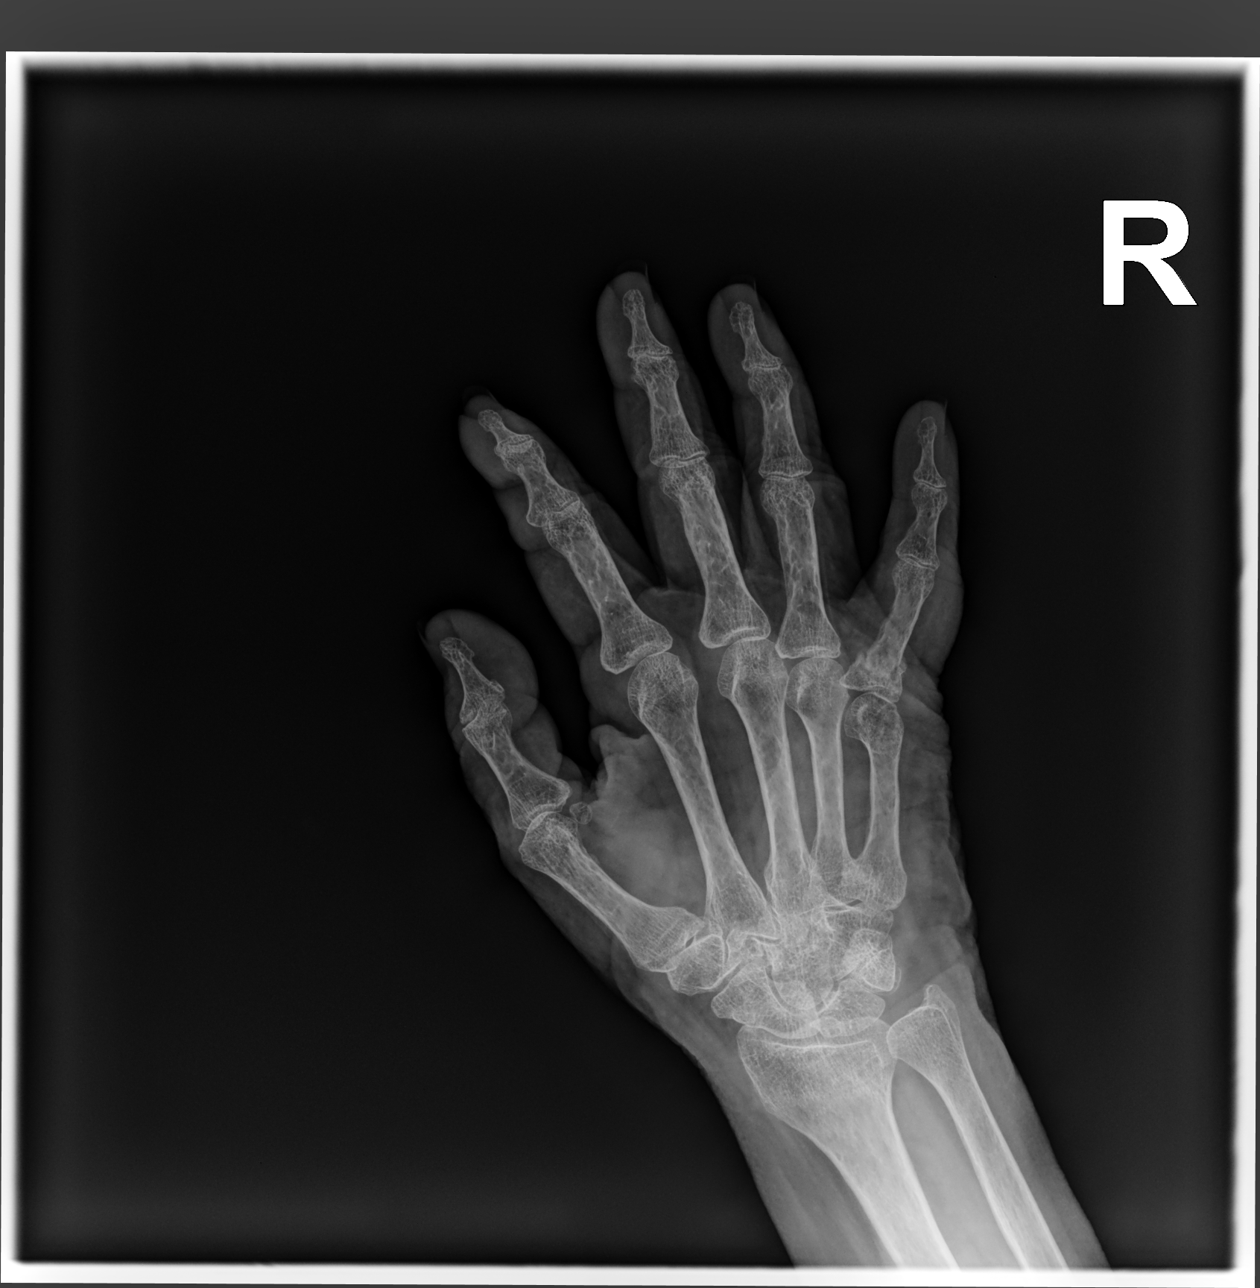

[hand lat]
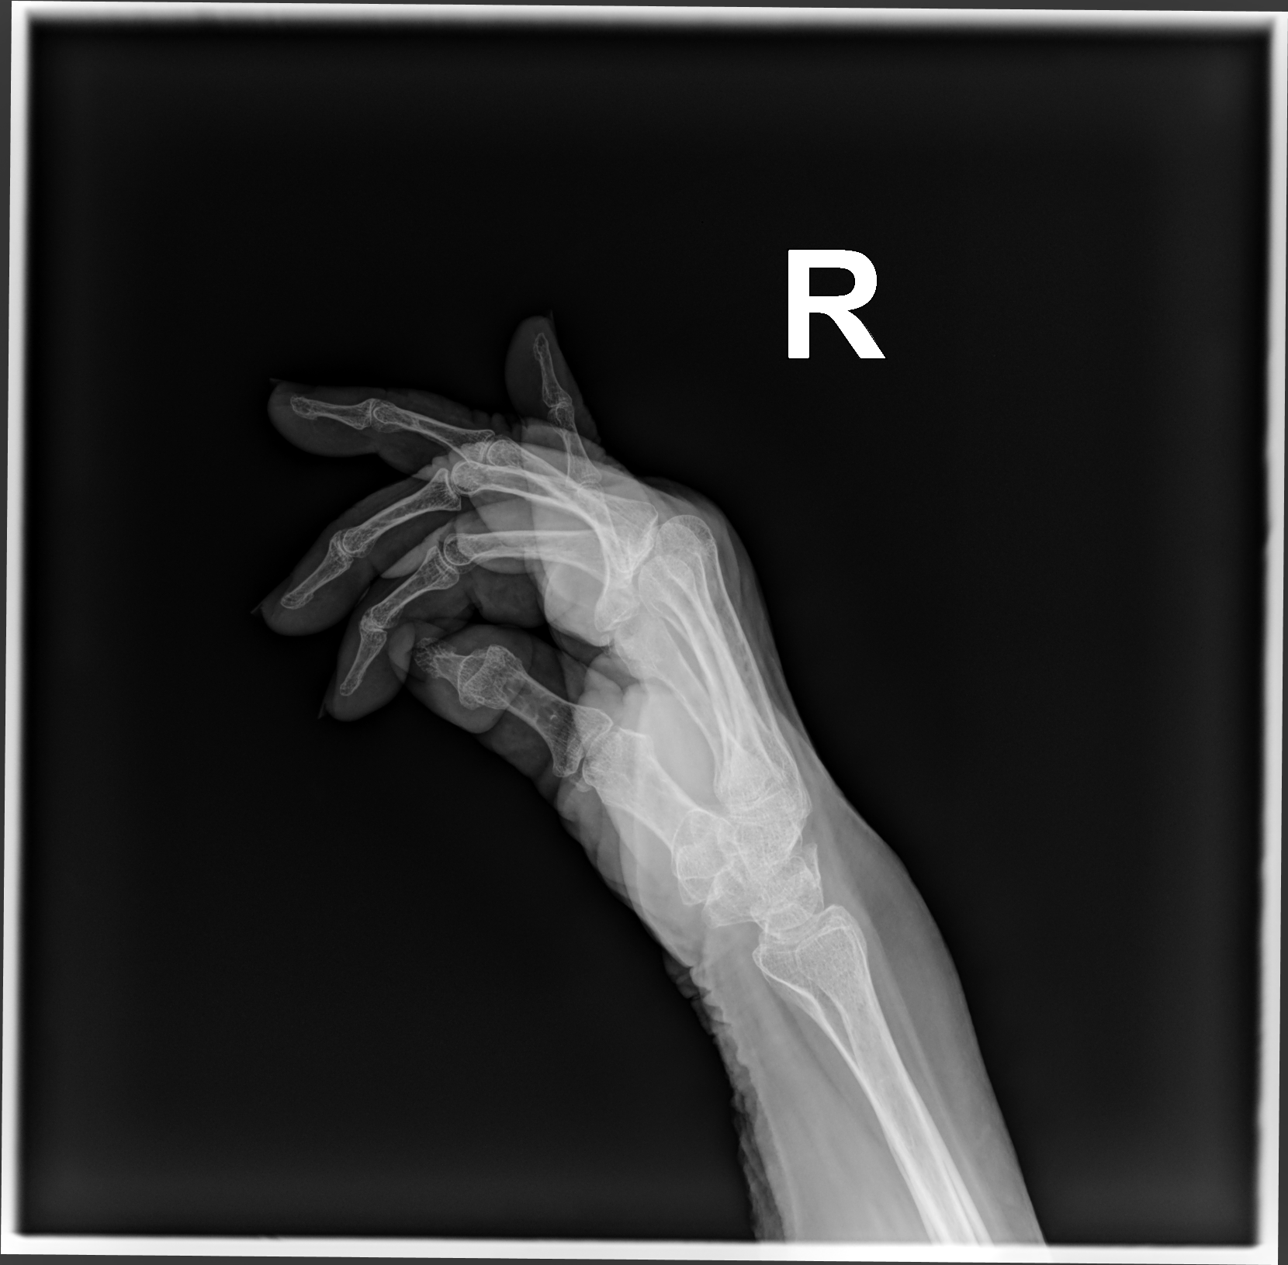

[3 of 3 positions shown; findings below may reference images not displayed]

FINDINGS: There is again noted minimally displaced fracture involving the
proximal portion of the fifth proximal phalanx. Callus formation is
noted suggesting healing. No new fracture or dislocation is noted.
IMPRESSION: Healing fifth proximal phalangeal fracture is noted.

## 2024-08-09 ENCOUNTER — Encounter
